# Patient Record
Sex: Female | Born: 1995 | Race: Black or African American | Hispanic: No | Marital: Married | Smoking: Never smoker
Health system: Southern US, Community
[De-identification: ages and names within clinical notes are randomized; demographics above are authoritative.]

## PROBLEM LIST (undated history)

## (undated) DIAGNOSIS — I10 Essential (primary) hypertension: Secondary | ICD-10-CM

---

## 2017-05-23 ENCOUNTER — Inpatient Hospital Stay (HOSPITAL_COMMUNITY): Payer: Medicaid Other

## 2017-05-23 ENCOUNTER — Inpatient Hospital Stay (HOSPITAL_COMMUNITY)
Admission: AD | Admit: 2017-05-23 | Discharge: 2017-05-27 | DRG: 786 | Disposition: A | Discharge status: Home / Self Care | Payer: Medicaid Other | Source: Ambulatory Visit | Attending: Family Medicine | Admitting: Family Medicine

## 2017-05-23 ENCOUNTER — Encounter (HOSPITAL_COMMUNITY): Payer: Self-pay | Admitting: *Deleted

## 2017-05-23 DIAGNOSIS — O411230 Chorioamnionitis, third trimester, not applicable or unspecified: Secondary | ICD-10-CM | POA: Diagnosis present

## 2017-05-23 DIAGNOSIS — O1494 Unspecified pre-eclampsia, complicating childbirth: Secondary | ICD-10-CM | POA: Diagnosis present

## 2017-05-23 DIAGNOSIS — O134 Gestational [pregnancy-induced] hypertension without significant proteinuria, complicating childbirth: Secondary | ICD-10-CM | POA: Diagnosis present

## 2017-05-23 DIAGNOSIS — Z3A42 42 weeks gestation of pregnancy: Secondary | ICD-10-CM | POA: Diagnosis not present

## 2017-05-23 DIAGNOSIS — O411030 Infection of amniotic sac and membranes, unspecified, third trimester, not applicable or unspecified: Secondary | ICD-10-CM | POA: Diagnosis not present

## 2017-05-23 DIAGNOSIS — O480 Post-term pregnancy: Secondary | ICD-10-CM | POA: Diagnosis present

## 2017-05-23 DIAGNOSIS — O9902 Anemia complicating childbirth: Secondary | ICD-10-CM | POA: Diagnosis present

## 2017-05-23 DIAGNOSIS — D649 Anemia, unspecified: Secondary | ICD-10-CM | POA: Diagnosis present

## 2017-05-23 DIAGNOSIS — O133 Gestational [pregnancy-induced] hypertension without significant proteinuria, third trimester: Secondary | ICD-10-CM

## 2017-05-23 HISTORY — DX: Essential (primary) hypertension: I10

## 2017-05-23 LAB — RAPID URINE DRUG SCREEN, HOSP PERFORMED
AMPHETAMINES: NOT DETECTED
BENZODIAZEPINES: NOT DETECTED
Barbiturates: NOT DETECTED
Cocaine: NOT DETECTED
OPIATES: NOT DETECTED
Tetrahydrocannabinol: NOT DETECTED

## 2017-05-23 LAB — COMPREHENSIVE METABOLIC PANEL
ALT: 13 U/L — ABNORMAL LOW (ref 14–54)
AST: 25 U/L (ref 15–41)
Albumin: 2.7 g/dL — ABNORMAL LOW (ref 3.5–5.0)
Alkaline Phosphatase: 142 U/L — ABNORMAL HIGH (ref 38–126)
Anion gap: 10 (ref 5–15)
BUN: 11 mg/dL (ref 6–20)
CHLORIDE: 103 mmol/L (ref 101–111)
CO2: 21 mmol/L — ABNORMAL LOW (ref 22–32)
Calcium: 8.5 mg/dL — ABNORMAL LOW (ref 8.9–10.3)
Creatinine, Ser: 0.67 mg/dL (ref 0.44–1.00)
Glucose, Bld: 93 mg/dL (ref 65–99)
POTASSIUM: 3.8 mmol/L (ref 3.5–5.1)
Sodium: 134 mmol/L — ABNORMAL LOW (ref 135–145)
Total Bilirubin: 0.4 mg/dL (ref 0.3–1.2)
Total Protein: 6.1 g/dL — ABNORMAL LOW (ref 6.5–8.1)

## 2017-05-23 LAB — GROUP B STREP BY PCR: GROUP B STREP BY PCR: NEGATIVE

## 2017-05-23 LAB — TYPE AND SCREEN
ABO/RH(D): O NEG
ANTIBODY SCREEN: NEGATIVE

## 2017-05-23 LAB — CBC
HCT: 30 % — ABNORMAL LOW (ref 36.0–46.0)
Hemoglobin: 9.9 g/dL — ABNORMAL LOW (ref 12.0–15.0)
MCH: 28.3 pg (ref 26.0–34.0)
MCHC: 33 g/dL (ref 30.0–36.0)
MCV: 85.7 fL (ref 78.0–100.0)
PLATELETS: 207 10*3/uL (ref 150–400)
RBC: 3.5 MIL/uL — ABNORMAL LOW (ref 3.87–5.11)
RDW: 16.1 % — AB (ref 11.5–15.5)
WBC: 10.7 10*3/uL — ABNORMAL HIGH (ref 4.0–10.5)

## 2017-05-23 LAB — OB RESULTS CONSOLE GBS: STREP GROUP B AG: NEGATIVE

## 2017-05-23 IMAGING — US US MFM OB LIMITED
1 series · 14 of 14 positions shown · non-contrast
Comparison: none

[Series 1: us mfm ob limited · 14 acquisitions, 14 frames shown]
[im 1/14]
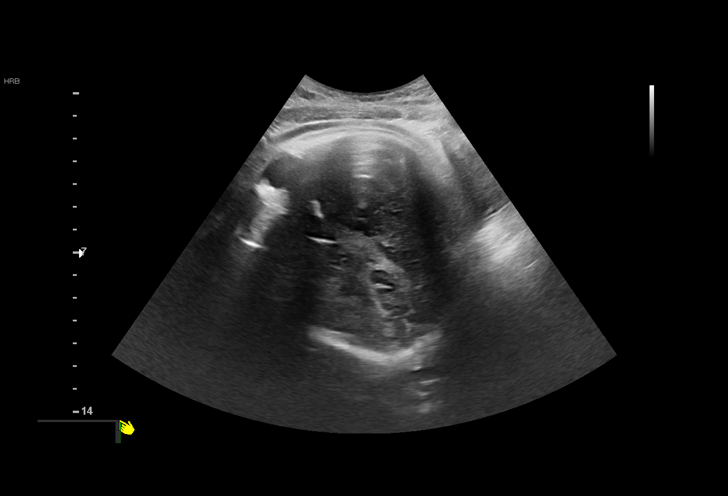
[im 2/14]
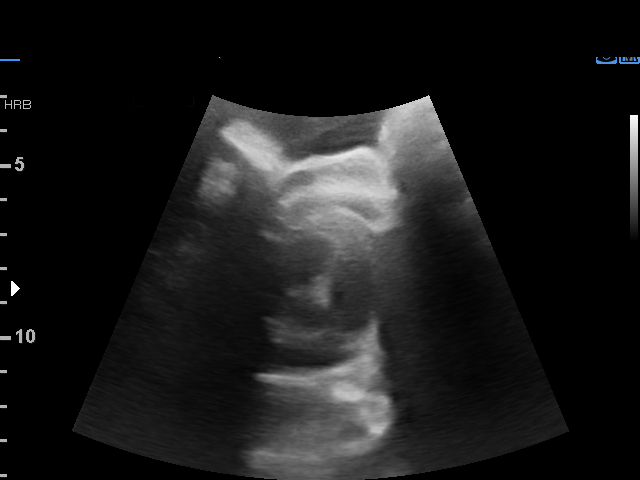
[im 3/14]
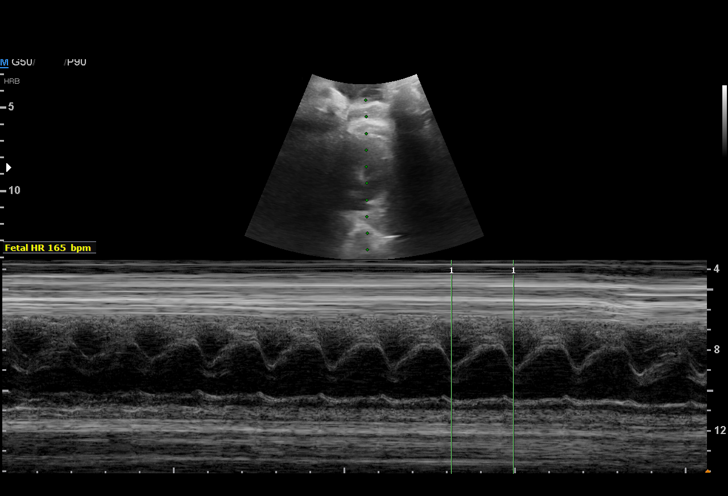
[im 4/14]
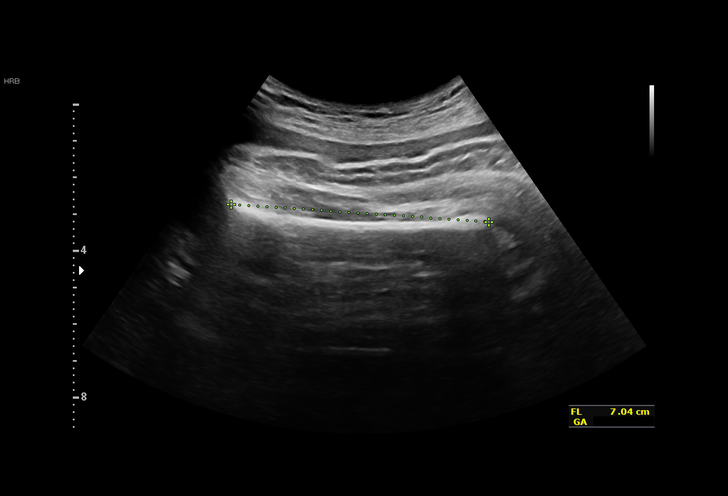
[im 5/14]
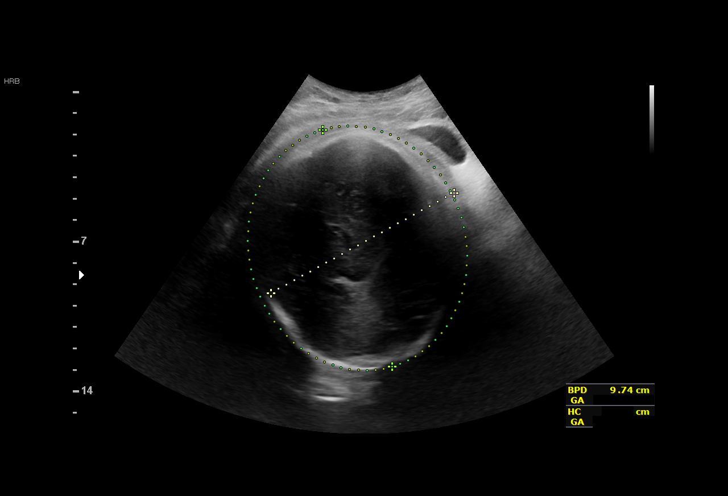
[im 6/14]
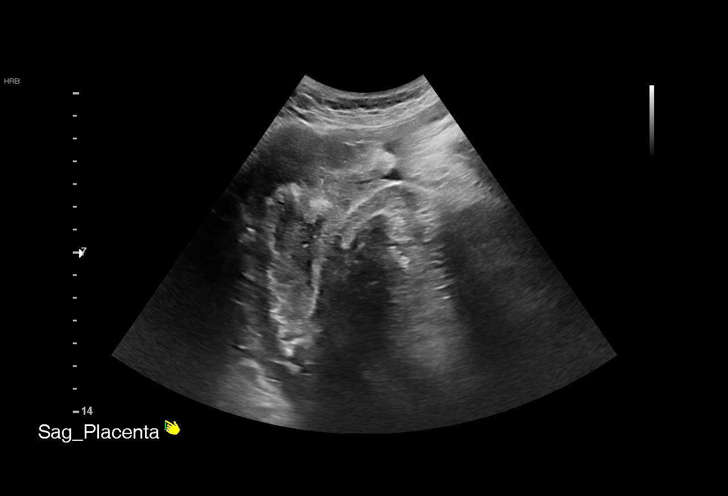
[im 7/14]
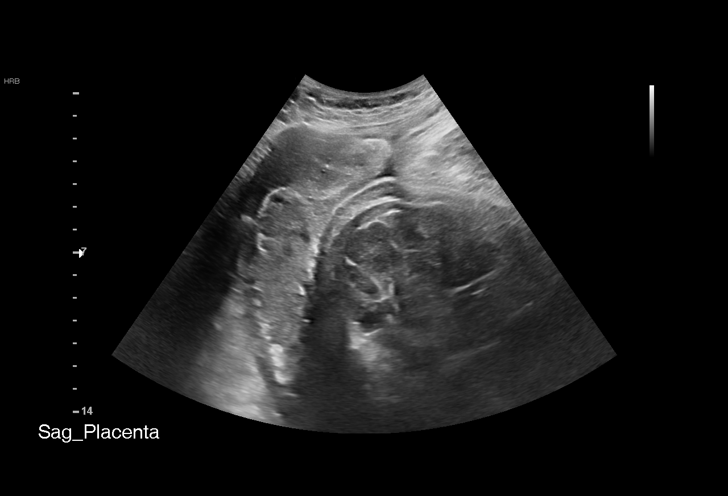
[im 8/14]
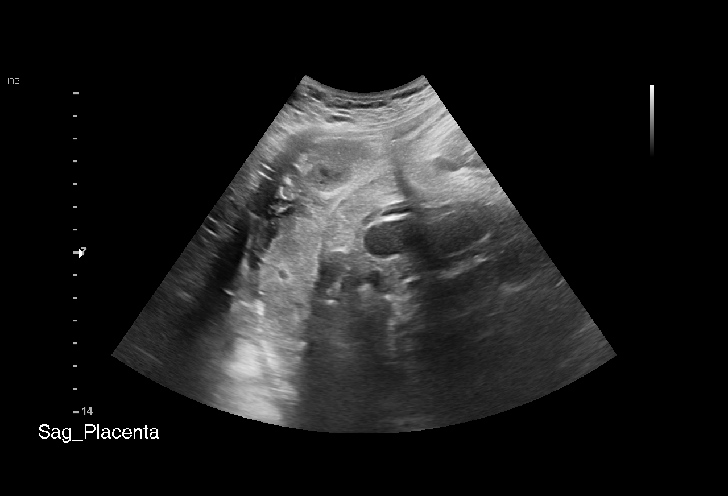
[im 9/14]
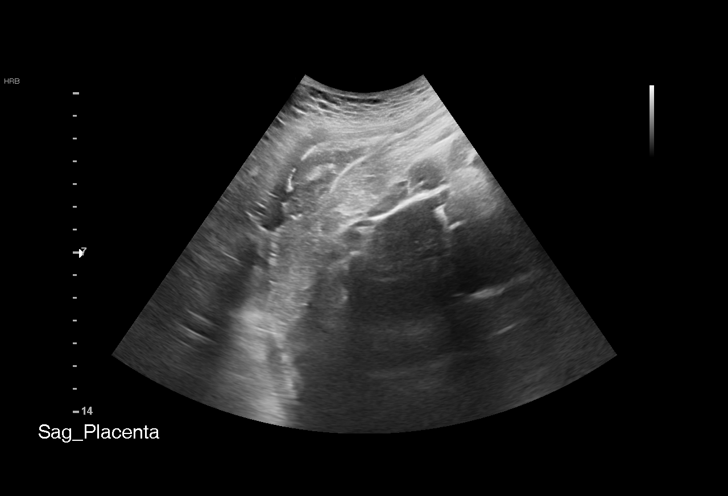
[im 10/14]
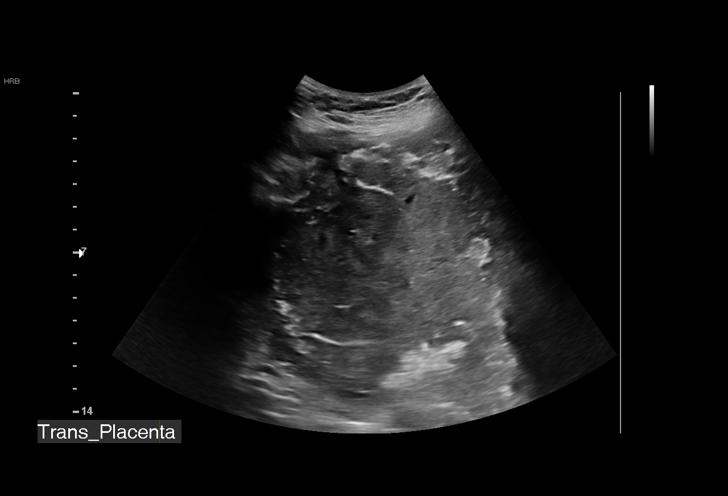
[im 11/14]
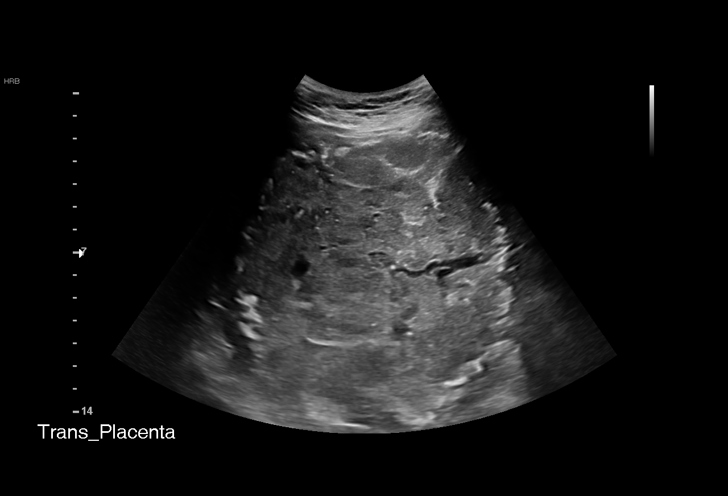
[im 12/14]
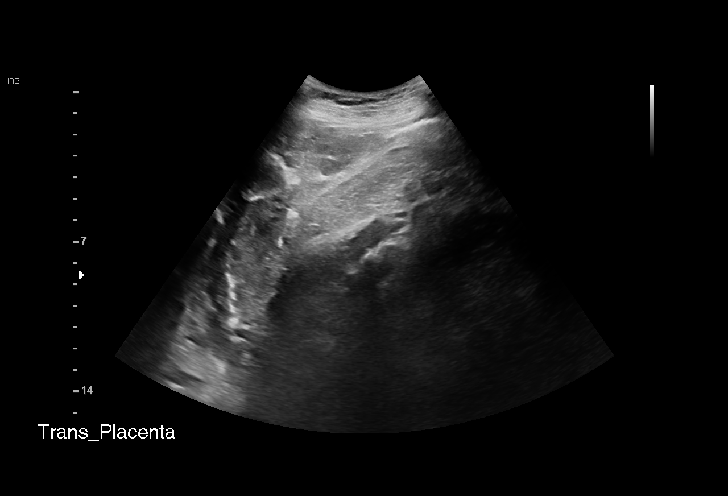
[im 13/14]
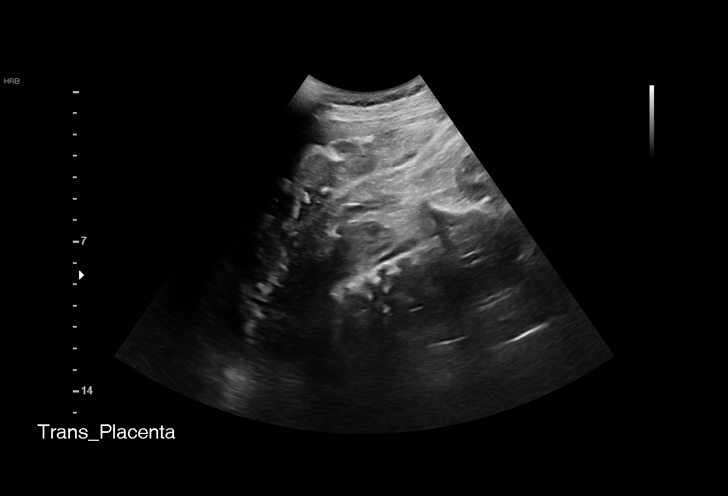
[im 14/14]
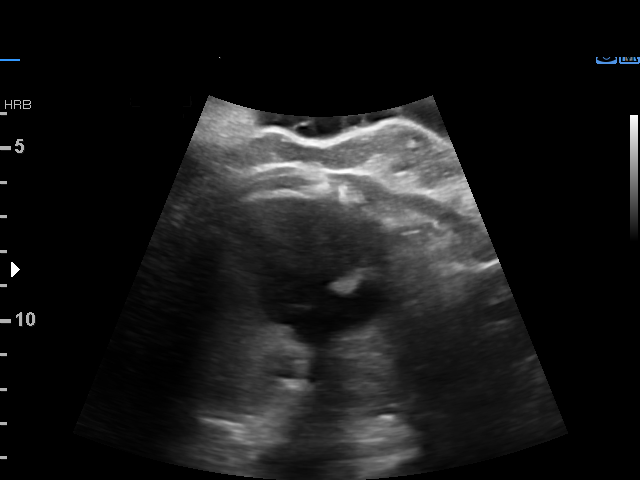

[14 of 14 positions shown; findings below may reference images not displayed]

1  MARCO ANTONIO            [PHONE_NUMBER]      [PHONE_NUMBER]     [PHONE_NUMBER]
Indications

42 weeks gestation of pregnancy
OB History

Blood Type:            Height:  4'11"  Weight (lb):  150       BMI:
Gravidity:    1
Fetal Evaluation

Num Of Fetuses:     1
Fetal Heart         165
Rate(bpm):
Cardiac Activity:   Observed
Placenta:           Visualized
Biometry

BPD:      97.4  mm     G. Age:  39w 6d         64  %    CI:        81.56   %    70 - 86
FL/HC:      20.7   %    20.1 -
HC:      340.4  mm     G. Age:  39w 1d
FL/BPD:     72.3   %
FL:       70.4  mm     G. Age:  36w 1d
Gestational Age

U/S Today:     38w 3d                                        EDD:   [DATE]
Best:          42w 1d     Det. By:  Previous Ultrasound      EDD:   [DATE]
Cervix Uterus Adnexa

Cervix
Not visualized (advanced GA >[CW])
Impression

Singleton intrauterine pregnancy at 42+1 weeks by dates with
limited prenatal care and elevated BP, here for limited scan to
confirm term gestation
Cephalic presentation
Placentation appears normal and there is no previa
Amniotic fluid levels are low
Review of the anatomy was not performed
Biometry is consistent with a term gestation
Recommendations

Continue clinical evaluation and management.

## 2017-05-23 MED ORDER — TERBUTALINE SULFATE 1 MG/ML IJ SOLN: 0.2500 mg | Freq: Once | INTRAMUSCULAR | Status: DC | PRN

## 2017-05-23 MED ORDER — OXYCODONE-ACETAMINOPHEN 5-325 MG PO TABS
2.0000 | ORAL_TABLET | ORAL | Status: DC | PRN
Start: 2017-05-23 — End: 2017-05-25

## 2017-05-23 MED ORDER — OXYTOCIN 40 UNITS IN LACTATED RINGERS INFUSION - SIMPLE MED
1.0000 m[IU]/min | INTRAVENOUS | Status: DC
  Administered 2017-05-24: 1 m[IU]/min via INTRAVENOUS

## 2017-05-23 MED ORDER — LIDOCAINE HCL (PF) 1 % IJ SOLN: 30.0000 mL | INTRAMUSCULAR | Status: DC | PRN

## 2017-05-23 MED ORDER — OXYCODONE-ACETAMINOPHEN 5-325 MG PO TABS: 1.0000 | ORAL_TABLET | ORAL | Status: DC | PRN

## 2017-05-23 MED ORDER — LACTATED RINGERS IV SOLN
500.0000 mL | INTRAVENOUS | Status: DC | PRN
  Administered 2017-05-24: 500 mL via INTRAVENOUS

## 2017-05-23 MED ORDER — SOD CITRATE-CITRIC ACID 500-334 MG/5ML PO SOLN
30.0000 mL | ORAL | Status: DC | PRN
  Administered 2017-05-25: 30 mL via ORAL
  Filled 2017-05-23: qty 15

## 2017-05-23 MED ORDER — ACETAMINOPHEN 325 MG PO TABS
650.0000 mg | ORAL_TABLET | ORAL | Status: DC | PRN
  Administered 2017-05-24: 650 mg via ORAL
  Filled 2017-05-23: qty 2

## 2017-05-23 MED ORDER — OXYTOCIN 40 UNITS IN LACTATED RINGERS INFUSION - SIMPLE MED
2.5000 [IU]/h | INTRAVENOUS | Status: DC
  Filled 2017-05-23: qty 1000

## 2017-05-23 MED ORDER — ONDANSETRON HCL 4 MG/2ML IJ SOLN: 4.0000 mg | Freq: Four times a day (QID) | INTRAMUSCULAR | Status: DC | PRN

## 2017-05-23 MED ORDER — OXYTOCIN BOLUS FROM INFUSION: 500.0000 mL | Freq: Once | INTRAVENOUS | Status: DC

## 2017-05-23 MED ORDER — LACTATED RINGERS IV SOLN
INTRAVENOUS | Status: DC
  Administered 2017-05-23 – 2017-05-24 (×4): via INTRAVENOUS

## 2017-05-23 MED ORDER — NALBUPHINE HCL 10 MG/ML IJ SOLN
5.0000 mg | INTRAMUSCULAR | Status: DC | PRN
Start: 2017-05-23 — End: 2017-05-25

## 2017-05-23 NOTE — Progress Notes (Addendum)
Patient ID: Jamie Brooks, female   DOB: 09/14/1995, 22 y.o.   MRN: [id number] Jamie Brooks is a 22 y.o. G1P0 at 8418w1d admitted for early labor, GHTN  Subjective: Uncomfortable w/ uc's. Denies ha, visual changes, ruq/epigastric pain, n/v.    Objective: BP (!) 133/94   Pulse (!) 105   Temp 98.6 F (37 C) (Oral)   Resp 18   Ht 4\' 11"  (1.499 m)   Wt 68 kg (150 lb)   SpO2 99%   BMI 30.30 kg/m  No intake/output data recorded.  FHT:  FHR: 150 bpm, variability: moderate,  accelerations:  Present,  decelerations:  Present variable, occ prolonged w/ occ tetanic uc or occ q 1min uc's UC:   irregular, every 1-10 minutes  SVE:   Dilation: 4 Effacement (%): 100 Station: -1 Exam by:: kim Chaniece Barbato,cnm  Labs: Lab Results  Component Value Date   WBC 10.7 (H) 05/23/2017   HGB 9.9 (L) 05/23/2017   HCT 30.0 (L) 05/23/2017   MCV 85.7 05/23/2017   PLT 207 05/23/2017    Assessment / Plan: Early labor, no cervical change in >4hrs, bp's stable, P:C ratio still pending. Discussed option of augmenting w/ arom or pitocin. Pt/family prefer pitocin. Does not want AROM at this time. RN can not start pitocin at this time d/t recent prolonged. Will reposition pt, start pitocin 1x1 30min after prolonged  Labor: early Fetal Wellbeing:  Category II Pain Control:  Labor support without medications Pre-eclampsia: labs still pending, bp's stable I/D:  n/a Anticipated MOD:  NSVD  Marge DuncansBooker, Takari Lundahl Randall CNM, WHNP-BC 05/23/2017, 11:44 PM

## 2017-05-23 NOTE — MAU Note (Signed)
Pt reports they were attempting a home birth and has been hurting for 20 hours so they came to the hospital. Denies bleeding or leaking fluid.

## 2017-05-23 NOTE — Anesthesia Pain Management Evaluation Note (Signed)
  CRNA Pain Management Visit Note  Patient: Jamie GreaserMikayla Shin, 22 y.o., female  "Hello I am a member of the anesthesia team at [facility]. We have an anesthesia team available at all times to provide care throughout the hospital, including epidural management and anesthesia for C-section. I don't know your plan for the delivery whether it a natural birth, water birth, IV sedation, nitrous supplementation, doula or epidural, but we want to meet your pain goals."   1.Was your pain managed to your expectations on prior hospitalizations?   No prior hospitalizations  2.What is your expectation for pain management during this hospitalization?     Labor support without medications  3.How can we help you reach that goal? Pt desires natural labor. Pain management options discussed and questions answered.  Record the patient's initial score and the patient's pain goal.   Pain: 5  Pain Goal: 10 [facility] wants you to be able to say your pain was always managed very well.  [place],Arber Wiemers 05/23/2017

## 2017-05-23 NOTE — H&P (Signed)
Jamie Brooks is a 22 y.o. female presenting for contractions. She reports that she has [facility] [place], and was planning on an unassisted homebirth. She states that she had regular prenatal care during the pregnancy, and had a 9 week US to confirm dates. She reports that she had elevated blood pressures around 38, and 39 weeks. She reports that she had lab work done at both of those visits, and it was "normal". However, she stopped going as she neared her due date. She states that she has had contractions for about 24 hours now, and that there has been someone at her house that has checked her cervix. She states that she has not made any change over this time. Patient reports that she is tired, and wishes to stay to have the baby at this time.  OB History    Gravida Para Term Preterm AB Living   1             SAB TAB Ectopic Multiple Live Births                 History reviewed. No pertinent past medical history. History reviewed. No pertinent surgical history. Family History: family history is not on file. Social History:  reports that  has never smoked. she has never used smokeless tobacco. She reports that she does not drink alcohol or use drugs.     Maternal Diabetes: No unknown, attempting to get records.  Genetic Screening: Declined Maternal Ultrasounds/Referrals: Declined Fetal Ultrasounds or other Referrals:  None Maternal Substance Abuse:  No UDS pending  Significant Maternal Medications:  None Significant Maternal Lab Results:  None Other Comments:  Patient reports some prenatal care [facility] [place]. Attempting to get records. GBS unknown.  Review of Systems  Constitutional: Negative for chills and fever.  Eyes: Negative for blurred vision.  Gastrointestinal: Negative for abdominal pain.  Neurological: Negative for headaches.   Maternal Medical History:  Reason for admission: Contractions.   Contractions: Onset was yesterday.    Frequency: regular.   Perceived severity is moderate.    Fetal activity: Perceived fetal activity is normal.   Last perceived fetal movement was within the past hour.    Prenatal Complications - Diabetes: none.      Blood pressure (!) 147/97, pulse 82, temperature 98.5 F (36.9 C), temperature source Oral, resp. rate 16, height 4\' 11"  (1.499 m), weight 150 lb (68 kg), SpO2 100 %. Maternal Exam:  Uterine Assessment: Contraction strength is mild.  Contraction duration is 60 seconds. Contraction frequency is regular.   Abdomen: Patient reports no abdominal tenderness. Fetal presentation: vertex  Introitus: Ferning test: negative.   Pelvis: adequate for delivery.      Fetal Exam Fetal Monitor Review: Mode: ultrasound.   Baseline rate: 145.  Variability: moderate (6-25 bpm).   Pattern: accelerations present and variable decelerations.    Fetal State Assessment: Category II - tracings are indeterminate.     Physical Exam  Nursing note and vitals reviewed. Constitutional: She is oriented to person, place, and time. She appears well-developed and well-nourished. No distress.  HENT:  Head: Normocephalic.  Cardiovascular: Normal rate.  Respiratory: Effort normal.  GI: Soft. There is no tenderness. There is no rebound.  Neurological: She is alert and oriented to person, place, and time.  Skin: Skin is warm and dry.  Psychiatric: She has a normal mood and affect.    Prenatal labs: ABO, Rh:   Antibody:   Rubella:  RPR:    HBsAg:    HIV:    GBS:     Assessment/Plan: 1. Gestational hypertension 2. Post-dates pregnancy 3. Limited prenatal care  Admit to labor and delivery  Rapid GBS pending Cat 2 FHR tracing, will continue to monitor Limited US to confirm dates Consider pitocin PRN labor progress Dr. Adrian Blackwater aware, and agrees with plan   Thressa Sheller 05/23/2017, 6:33 PM

## 2017-05-23 NOTE — Progress Notes (Addendum)
Labor Progress Note Jamie Brooks is a [age] y.o. G1P0 at 4946w1d presented for prolonged home birth labor, her pregnancy is complicated by post-dates and reported diagnosis of preE (she did not come in for IOL).  S:  Patient is endorsing some fatigue, she is still unsure how she would like to augment labor and is mulling it over.   O:  BP 127/81   Pulse 83   Temp 98.6 F (37 C) (Oral)   Resp 18   Ht 4\' 11"  (1.499 m)   Wt 68 kg (150 lb)   SpO2 99%   BMI 30.30 kg/m  EFM: 140/mod var/+accels, occasional variable decels with poor pick up on NST  CVE: Dilation: 4 Effacement (%): 100 Cervical Position: Middle Station: -1 Presentation: Vertex Exam by:: Zorita PangH Hogan CNM   A&P: 22 y.o. G1P0 6346w1d c/b PD, PreE, here for contractions/ IOL. #Labor: Awaiting requests from patient for preference for augmentation of labor, she is continuing to decide risks/ benefits and wants as minimal augmentation as possible, but has been have regular contractions >24 hours. Willing to AROM/ start pit gtt. #PreE: here for labor, Pr/Cr pending. Some HTN but not in severe ranges. #Pain: not requesting epidural / meds at this time. #FWB: Cat II NST #GBS negative  Alroy BailiffParker W Adalynne Steffensmeier, MD 10:24 PM

## 2017-05-24 ENCOUNTER — Inpatient Hospital Stay (HOSPITAL_COMMUNITY): Payer: Medicaid Other | Admitting: Anesthesiology

## 2017-05-24 ENCOUNTER — Other Ambulatory Visit: Payer: Self-pay

## 2017-05-24 LAB — RAPID HIV SCREEN (HIV 1/2 AB+AG)
HIV 1/2 ANTIBODIES: NONREACTIVE
HIV-1 P24 Antigen - HIV24: NONREACTIVE

## 2017-05-24 LAB — PROTEIN / CREATININE RATIO, URINE
Creatinine, Urine: 73 mg/dL
PROTEIN CREATININE RATIO: 1.18 mg/mg{creat} — AB (ref 0.00–0.15)
TOTAL PROTEIN, URINE: 86 mg/dL

## 2017-05-24 LAB — RPR: RPR Ser Ql: NONREACTIVE

## 2017-05-24 LAB — GC/CHLAMYDIA PROBE AMP (~~LOC~~) NOT AT ARMC
Chlamydia: NEGATIVE
NEISSERIA GONORRHEA: NEGATIVE

## 2017-05-24 LAB — CBC
HEMATOCRIT: 30.8 % — AB (ref 36.0–46.0)
Hemoglobin: 10.1 g/dL — ABNORMAL LOW (ref 12.0–15.0)
MCH: 28 pg (ref 26.0–34.0)
MCHC: 32.8 g/dL (ref 30.0–36.0)
MCV: 85.3 fL (ref 78.0–100.0)
Platelets: 211 10*3/uL (ref 150–400)
RBC: 3.61 MIL/uL — ABNORMAL LOW (ref 3.87–5.11)
RDW: 16.2 % — AB (ref 11.5–15.5)
WBC: 13.3 10*3/uL — ABNORMAL HIGH (ref 4.0–10.5)

## 2017-05-24 LAB — OB RESULTS CONSOLE HEPATITIS B SURFACE ANTIGEN: Hepatitis B Surface Ag: NEGATIVE

## 2017-05-24 LAB — HEPATITIS B SURFACE ANTIGEN: HEP B S AG: NEGATIVE

## 2017-05-24 LAB — ABO/RH: ABO/RH(D): O NEG

## 2017-05-24 MED ORDER — EPHEDRINE 5 MG/ML INJ: 10.0000 mg | INTRAVENOUS | Status: DC | PRN

## 2017-05-24 MED ORDER — LABETALOL HCL 5 MG/ML IV SOLN: 20.0000 mg | INTRAVENOUS | Status: DC | PRN

## 2017-05-24 MED ORDER — PHENYLEPHRINE 40 MCG/ML (10ML) SYRINGE FOR IV PUSH (FOR BLOOD PRESSURE SUPPORT)
80.0000 ug | PREFILLED_SYRINGE | INTRAVENOUS | Status: DC | PRN
  Filled 2017-05-24: qty 10

## 2017-05-24 MED ORDER — DIPHENHYDRAMINE HCL 50 MG/ML IJ SOLN: 12.5000 mg | INTRAMUSCULAR | Status: DC | PRN

## 2017-05-24 MED ORDER — LIDOCAINE HCL (PF) 1 % IJ SOLN
INTRAMUSCULAR | Status: DC | PRN
  Administered 2017-05-24 (×2): 4 mL via EPIDURAL

## 2017-05-24 MED ORDER — SODIUM CHLORIDE 0.9 % IV SOLN
2.0000 g | Freq: Four times a day (QID) | INTRAVENOUS | Status: DC
  Administered 2017-05-24: 2 g via INTRAVENOUS
  Filled 2017-05-24 (×3): qty 2000

## 2017-05-24 MED ORDER — FENTANYL 2.5 MCG/ML BUPIVACAINE 1/10 % EPIDURAL INFUSION (WH - ANES)
14.0000 mL/h | INTRAMUSCULAR | Status: DC | PRN
  Administered 2017-05-24: 11 mL/h via EPIDURAL
  Filled 2017-05-24: qty 100

## 2017-05-24 MED ORDER — PHENYLEPHRINE 40 MCG/ML (10ML) SYRINGE FOR IV PUSH (FOR BLOOD PRESSURE SUPPORT): 80.0000 ug | PREFILLED_SYRINGE | INTRAVENOUS | Status: DC | PRN

## 2017-05-24 MED ORDER — GENTAMICIN SULFATE 40 MG/ML IJ SOLN
130.0000 mg | Freq: Three times a day (TID) | INTRAVENOUS | Status: DC
  Administered 2017-05-24: 130 mg via INTRAVENOUS
  Filled 2017-05-24 (×3): qty 3.25

## 2017-05-24 MED ORDER — HYDRALAZINE HCL 20 MG/ML IJ SOLN: 10.0000 mg | Freq: Once | INTRAMUSCULAR | Status: DC | PRN

## 2017-05-24 MED ORDER — LACTATED RINGERS IV SOLN: 500.0000 mL | Freq: Once | INTRAVENOUS | Status: DC

## 2017-05-24 MED ORDER — OXYTOCIN 40 UNITS IN LACTATED RINGERS INFUSION - SIMPLE MED: 1.0000 m[IU]/min | INTRAVENOUS | Status: DC

## 2017-05-24 MED ORDER — ACETAMINOPHEN 325 MG PO TABS
650.0000 mg | ORAL_TABLET | Freq: Four times a day (QID) | ORAL | Status: DC | PRN
Start: 2017-05-24 — End: 2017-05-25

## 2017-05-24 NOTE — Progress Notes (Signed)
Patient ID: Jamie Brooks, female   DOB: 10/05/1995, 22 y.o.   MRN: [id number] Jamie Brooks is a 22 y.o. G1P0 at 3780w2d admitted for early labor, pre-eclampsia  Subjective: Uncomfortable w/ uc's. Denies ha, visual changes, ruq/epigastric pain, n/v.    Objective: BP 136/82   Pulse 82   Temp 98.4 F (36.9 C) (Oral)   Resp 18   Ht 4\' 11"  (1.499 m)   Wt 68 kg (150 lb)   SpO2 99%   BMI 30.30 kg/m  No intake/output data recorded.  FHT:  FHR: 135 bpm, variability: moderate,  accelerations:  Present,  decelerations:  Present occ variables UC:   q 1-737mins  SVE:   Dilation: 5.5 Effacement (%): 100 Station: -1, 0 Exam by:: amwalker,rn  Pitocin @ 4 mu/min  Labs: Lab Results  Component Value Date   WBC 10.7 (H) 05/23/2017   HGB 9.9 (L) 05/23/2017   HCT 30.0 (L) 05/23/2017   MCV 85.7 05/23/2017   PLT 207 05/23/2017    Assessment / Plan: Augmentation of labor, progressing well  Labor: Progressing normally Fetal Wellbeing:  Category II Pain Control:  Labor support without medications Pre-eclampsia: w/o severe fx, bp's stable I/D:  n/a Anticipated MOD:  NSVD  Marge DuncansBooker, Sargent Mankey Randall CNM, WHNP-BC 05/24/2017, 4:12 AM

## 2017-05-24 NOTE — Progress Notes (Signed)
Labor Progress Note  Jamie Brooks is a 22 y.o. G1P0 at 526w2d  admitted for early labor, preeclampsia  S: Comfortable with epidural. Wanting to rest.  O:  BP (!) 131/97   Pulse 89   Temp 97.8 F (36.6 C) (Axillary)   Resp 20   Ht 4\' 11"  (1.499 m)   Wt 150 lb (68 kg)   SpO2 95%   BMI 30.30 kg/m   No intake/output data recorded.  FHT:  FHR: 130 bpm, variability: moderate,  accelerations:  Present,  decelerations:  Present variable UC:   irregular, every 2-7 minutes SVE:   Dilation: 8.5 Effacement (%): 90 Station: +1 Exam by:: sowder Ruptured  Pitocin @ 8 mu/min  Labs: Lab Results  Component Value Date   WBC 13.3 (H) 05/24/2017   HGB 10.1 (L) 05/24/2017   HCT 30.8 (L) 05/24/2017   MCV 85.3 05/24/2017   PLT 211 05/24/2017    Assessment / Plan: 22 y.o. G1P0 6826w2d  in active labor Augmentation of labor, progressing well  Labor: Progressing normally Fetal Wellbeing:  Category II Pain Control:  Epidural Anticipated MOD:  NSVD  Expectant management   Caryl AdaJazma Juley Giovanetti, DO OB Fellow

## 2017-05-24 NOTE — Progress Notes (Signed)
Patient ID: Jamie GreaserMikayla Brooks, female   DOB: 09/22/1995, 22 y.o.   MRN: [id number] Jamie Brooks is a 22 y.o. G1P0 at 8182w2d admitted for early labor, pre-eclampsia w/o severe features  Subjective: Uncomfortable w/ uc's, wants AROM  Objective: BP (!) 156/99   Pulse 80   Temp 97.9 F (36.6 C) (Oral)   Resp 20   Ht 4\' 11"  (1.499 m)   Wt 68 kg (150 lb)   SpO2 97%   BMI 30.30 kg/m  No intake/output data recorded.  FHT:  FHR: 125 bpm, variability: moderate,  accelerations:  Present,  decelerations:  Present occ variable/prolonged UC:   q 1-874mins  SVE:   Dilation: (P) 6 Effacement (%): (P) 100 Station: (P) 0 Exam by:: (P) Evea Sheek  AROM scant amt clear fluid  Pitocin @ 4 mu/min  Labs: Lab Results  Component Value Date   WBC 10.7 (H) 05/23/2017   HGB 9.9 (L) 05/23/2017   HCT 30.0 (L) 05/23/2017   MCV 85.7 05/23/2017   PLT 207 05/23/2017    Assessment / Plan: Augmentation of labor, progressing well, now ruptured  Labor: Progressing normally Fetal Wellbeing:  Category II Pain Control:  Labor support without medications Pre-eclampsia: w/o severe features, bp's stable I/D:  n/a Anticipated MOD:  NSVD  Marge DuncansBooker, Derren Suydam Randall CNM, WHNP-BC 05/24/2017, 8:14 AM

## 2017-05-24 NOTE — Progress Notes (Signed)
ANTIBIOTIC CONSULT NOTE - INITIAL  Pharmacy Consult for Gentamicin Indication: Chorioamnionitis   No Known Allergies  Patient Measurements: Height: 4\' 11"  (149.9 cm) Weight: 150 lb (68 kg) IBW/kg (Calculated) : 43.2 Adjusted Body Weight: 52.3  Vital Signs: Temp: 99.2 F (37.3 C) (01/16 2030) Temp Source: Oral (01/16 2030) BP: 143/111 (01/16 2100) Pulse Rate: 128 (01/16 2100)  Labs: Recent Labs    05/23/17 1742 05/23/17 1910 05/24/17 1321  WBC  --  10.7* 13.3*  HGB  --  9.9* 10.1*  PLT  --  207 211  LABCREA 73.00  --   --   CREATININE  --  0.67  --    No results for input(s): GENTTROUGH, GENTPEAK, GENTRANDOM in the last 72 hours.   Microbiology: Recent Results (from the past 720 hour(s))  OB RESULT CONSOLE Group B Strep     Status: None   Collection Time: 05/23/17 12:00 AM  Result Value Ref Range Status   GBS Negative  Final  Group B strep by PCR     Status: None   Collection Time: 05/23/17  7:35 PM  Result Value Ref Range Status   Group B strep by PCR NEGATIVE NEGATIVE Final    Medications:  Ampicillin 2g IV q6h  Assessment: 22 y.o. female G1P0 at 3650w2d  Estimated Ke = 0.287, Vd = 20.9  Goal of Therapy:  Gentamicin peak 6-8 mg/L and Trough < 1 mg/L  Plan:   Gentamicin 130 mg IV every 8 hrs  Will check gentamicin levels if continued > 72hr or clinically indicated.  Jamie Brooks, [name] 05/24/2017,10:26 PM

## 2017-05-24 NOTE — Anesthesia Preprocedure Evaluation (Signed)
Anesthesia Evaluation  Patient identified by MRN, date of birth, ID band Patient awake    Reviewed: Allergy & Precautions, NPO status , Patient's Chart, lab work & pertinent test results  Airway Mallampati: II  TM Distance: >3 FB Neck ROM: Full    Dental no notable dental hx. (+) Dental Advisory Given   Pulmonary neg pulmonary ROS,    Pulmonary exam normal        Cardiovascular hypertension, negative cardio ROS Normal cardiovascular exam     Neuro/Psych negative neurological ROS  negative psych ROS   GI/Hepatic negative GI ROS, Neg liver ROS,   Endo/Other  negative endocrine ROS  Renal/GU negative Renal ROS  negative genitourinary   Musculoskeletal negative musculoskeletal ROS (+)   Abdominal   Peds negative pediatric ROS (+)  Hematology negative hematology ROS (+)   Anesthesia Other Findings   Reproductive/Obstetrics (+) Pregnancy                             Anesthesia Physical Anesthesia Plan  ASA: III  Anesthesia Plan: Epidural   Post-op Pain Management:    Induction:   PONV Risk Score and Plan:   Airway Management Planned: Natural Airway  Additional Equipment:   Intra-op Plan:   Post-operative Plan:   Informed Consent: I have reviewed the patients History and Physical, chart, labs and discussed the procedure including the risks, benefits and alternatives for the proposed anesthesia with the patient or authorized representative who has indicated his/her understanding and acceptance.   Dental advisory given  Plan Discussed with: Anesthesiologist  Anesthesia Plan Comments:         Anesthesia Quick Evaluation

## 2017-05-24 NOTE — Progress Notes (Signed)
Patient ID: Jamie Brooks, female   DOB: 11/10/1995, 22 y.o.   MRN: [id number] Discussed labor coping and reviewed options. Patient decided on epidural after thorough discussion.   Plan - CBC (last was > 6 hrs prior) - Epidural placement - 1-2hr rest before next cervical exam check

## 2017-05-24 NOTE — Progress Notes (Signed)
Patient ID: Jamie Brooks, female   DOB: 05/13/1995, 22 y.o.   MRN: [id number]  [facility] for fetal bradycardia for 6 minutes. Position chagnes had been made, FSE placed and pitocin stopped.  I examined patient and FSE mostly on maternal cervix and thus not correctly monitoring fetal HR. We placed typical abdominal monitor and were able to assure fetal well being with FHR in 130s. Given non-emergent nature discussed replacement vs external monitor. Patient opted for replacement of internal monitors. Placed new FSE and IUPC. Patient progressed to 7.5/8 cm.   Federico FlakeKimberly Niles Cabell Lazenby, MD, MPH, ABFM Attending Physician Faculty Practice- [facility]

## 2017-05-24 NOTE — Anesthesia Procedure Notes (Signed)
Epidural Patient location during procedure: OB Start time: 05/24/2017 1:51 PM End time: 05/24/2017 2:05 PM  Staffing Anesthesiologist: Heather RobertsSinger, Marlet Korte, MD Performed: anesthesiologist   Preanesthetic Checklist Completed: patient identified, site marked, pre-op evaluation, timeout performed, IV checked, risks and benefits discussed and monitors and equipment checked  Epidural Patient position: sitting Prep: DuraPrep Patient monitoring: heart rate, cardiac monitor, continuous pulse ox and blood pressure Approach: midline Location: L2-L3 Injection technique: LOR saline  Needle:  Needle type: Tuohy  Needle gauge: 17 G Needle length: 9 cm Needle insertion depth: 5 cm Catheter size: 20 Guage Catheter at skin depth: 10 cm Test dose: negative and Other  Assessment Events: blood not aspirated, injection not painful, no injection resistance and negative IV test  Additional Notes Informed consent obtained prior to proceeding including risk of failure, 1% risk of PDPH, risk of minor discomfort and bruising.  Discussed rare but serious complications including epidural abscess, permanent nerve injury, epidural hematoma.  Discussed alternatives to epidural analgesia and patient desires to proceed.  Timeout performed pre-procedure verifying patient name, procedure, and platelet count.  Patient tolerated procedure well.

## 2017-05-24 NOTE — Progress Notes (Signed)
LABOR PROGRESS NOTE  Karma GreaserMikayla Mccanless is a 22 y.o. G1P0 at 8265w2d  admitted for labor and pre-eclampsia  Subjective: Patient doing well   Objective: BP (!) 143/111   Pulse (!) 128   Temp 99.2 F (37.3 C) (Oral)   Resp 16   Ht 4\' 11"  (1.499 m)   Wt 150 lb (68 kg)   SpO2 95%   BMI 30.30 kg/m  or  Vitals:   05/24/17 1930 05/24/17 2000 05/24/17 2030 05/24/17 2100  BP: (!) 136/97 132/86 140/88 (!) 143/111  Pulse: (!) 115 (!) 119 (!) 122 (!) 128  Resp: 18 16 18 16   Temp:   99.2 F (37.3 C)   TempSrc:   Oral   SpO2:      Weight:      Height:        SVE at 21:15 Dilation: Lip/rim Effacement (%): 100 Cervical Position: Middle Station: 0 Presentation: Vertex Exam by:: Dr. Despina HiddenEure FHT: baseline rate 150, moderate varibility, +acel, no decel Toco: ctx q5-6 min  Labs: Lab Results  Component Value Date   WBC 13.3 (H) 05/24/2017   HGB 10.1 (L) 05/24/2017   HCT 30.8 (L) 05/24/2017   MCV 85.3 05/24/2017   PLT 211 05/24/2017    Patient Active Problem List   Diagnosis Date Noted  . Post-dates pregnancy 05/23/2017    Assessment / Plan: 22 y.o. G1P0 at 9565w2d here for labor and pre-eclampsia  Labor: Patient with arrest of dilation since for 6-7 hour how. Dr. Despina HiddenEure re-checked patient and confirmed still no change, and discussed risk and benefits of proceeding with C/S. Pt agrees.  The risks of cesarean section discussed with the patient included but were not limited to: bleeding which may require transfusion or reoperation; infection which may require antibiotics; injury to bowel, bladder, ureters or other surrounding organs; injury to the fetus; need for additional procedures including hysterectomy in the event of a life-threatening hemorrhage; placental abnormalities wth subsequent pregnancies, incisional problems, thromboembolic phenomenon and other postoperative/anesthesia complications. The patient concurred with the proposed plan, giving informed written consent for the  procedure. Anesthesia and OR aware. Preoperative prophylactic antibiotics and SCDs ordered on call to the OR. To OR when ready.   Fetal Wellbeing:  Cat II Pain Control:  Epidural Anticipated MOD:  C/S  Frederik PearJulie P Artelia Game, MD 05/24/2017, 9:53 PM

## 2017-05-25 ENCOUNTER — Encounter (HOSPITAL_COMMUNITY): Payer: Self-pay | Admitting: Anesthesiology

## 2017-05-25 ENCOUNTER — Encounter (HOSPITAL_COMMUNITY)
Admission: AD | Disposition: A | Discharge status: Home / Self Care | Payer: Self-pay | Source: Ambulatory Visit | Attending: Family Medicine

## 2017-05-25 DIAGNOSIS — O480 Post-term pregnancy: Secondary | ICD-10-CM

## 2017-05-25 DIAGNOSIS — Z3A42 42 weeks gestation of pregnancy: Secondary | ICD-10-CM

## 2017-05-25 DIAGNOSIS — O411030 Infection of amniotic sac and membranes, unspecified, third trimester, not applicable or unspecified: Secondary | ICD-10-CM

## 2017-05-25 DIAGNOSIS — O1494 Unspecified pre-eclampsia, complicating childbirth: Secondary | ICD-10-CM

## 2017-05-25 LAB — CBC
HCT: 26 % — ABNORMAL LOW (ref 36.0–46.0)
HEMOGLOBIN: 8.5 g/dL — AB (ref 12.0–15.0)
MCH: 27.9 pg (ref 26.0–34.0)
MCHC: 32.7 g/dL (ref 30.0–36.0)
MCV: 85.2 fL (ref 78.0–100.0)
Platelets: 162 10*3/uL (ref 150–400)
RBC: 3.05 MIL/uL — ABNORMAL LOW (ref 3.87–5.11)
RDW: 16.7 % — ABNORMAL HIGH (ref 11.5–15.5)
WBC: 12.3 10*3/uL — AB (ref 4.0–10.5)

## 2017-05-25 LAB — RUBELLA SCREEN: Rubella: 1.04 index (ref >0.99)

## 2017-05-25 SURGERY — Surgical Case
Anesthesia: Epidural

## 2017-05-25 MED ORDER — SIMETHICONE 80 MG PO CHEW
80.0000 mg | CHEWABLE_TABLET | Freq: Three times a day (TID) | ORAL | Status: DC
  Administered 2017-05-25 – 2017-05-26 (×6): 80 mg via ORAL
  Filled 2017-05-25 (×7): qty 1

## 2017-05-25 MED ORDER — SODIUM CHLORIDE 0.9 % IR SOLN
Status: DC | PRN
  Administered 2017-05-25: 1

## 2017-05-25 MED ORDER — MENTHOL 3 MG MT LOZG: 1.0000 | LOZENGE | OROMUCOSAL | Status: DC | PRN

## 2017-05-25 MED ORDER — DEXAMETHASONE SODIUM PHOSPHATE 10 MG/ML IJ SOLN
INTRAMUSCULAR | Status: AC
  Filled 2017-05-25: qty 1

## 2017-05-25 MED ORDER — KETOROLAC TROMETHAMINE 30 MG/ML IJ SOLN: 30.0000 mg | Freq: Four times a day (QID) | INTRAMUSCULAR | Status: AC | PRN

## 2017-05-25 MED ORDER — WITCH HAZEL-GLYCERIN EX PADS: 1.0000 "application " | MEDICATED_PAD | CUTANEOUS | Status: DC | PRN

## 2017-05-25 MED ORDER — SODIUM BICARBONATE 8.4 % IV SOLN
INTRAVENOUS | Status: DC | PRN
  Administered 2017-05-25 (×3): 5 mL via EPIDURAL

## 2017-05-25 MED ORDER — LIDOCAINE-EPINEPHRINE (PF) 2 %-1:200000 IJ SOLN
INTRAMUSCULAR | Status: AC
  Filled 2017-05-25: qty 20

## 2017-05-25 MED ORDER — DIBUCAINE 1 % RE OINT: 1.0000 "application " | TOPICAL_OINTMENT | RECTAL | Status: DC | PRN

## 2017-05-25 MED ORDER — LACTATED RINGERS IV SOLN
INTRAVENOUS | Status: DC
  Administered 2017-05-25 (×2): via INTRAVENOUS

## 2017-05-25 MED ORDER — COCONUT OIL OIL
1.0000 "application " | TOPICAL_OIL | Status: DC | PRN
  Administered 2017-05-26: 1 via TOPICAL
  Filled 2017-05-25: qty 120

## 2017-05-25 MED ORDER — FENTANYL CITRATE (PF) 100 MCG/2ML IJ SOLN: 25.0000 ug | INTRAMUSCULAR | Status: DC | PRN

## 2017-05-25 MED ORDER — OXYCODONE HCL 5 MG PO TABS: 5.0000 mg | ORAL_TABLET | Freq: Once | ORAL | Status: DC | PRN

## 2017-05-25 MED ORDER — OXYTOCIN 10 UNIT/ML IJ SOLN
INTRAMUSCULAR | Status: AC
  Filled 2017-05-25: qty 4

## 2017-05-25 MED ORDER — SIMETHICONE 80 MG PO CHEW: 80.0000 mg | CHEWABLE_TABLET | ORAL | Status: DC | PRN

## 2017-05-25 MED ORDER — OXYCODONE HCL 5 MG PO TABS: 10.0000 mg | ORAL_TABLET | ORAL | Status: DC | PRN

## 2017-05-25 MED ORDER — SODIUM BICARBONATE 8.4 % IV SOLN
INTRAVENOUS | Status: AC
  Filled 2017-05-25: qty 50

## 2017-05-25 MED ORDER — OXYCODONE HCL 5 MG PO TABS: 5.0000 mg | ORAL_TABLET | ORAL | Status: DC | PRN

## 2017-05-25 MED ORDER — ONDANSETRON HCL 4 MG/2ML IJ SOLN
INTRAMUSCULAR | Status: AC
  Filled 2017-05-25: qty 2

## 2017-05-25 MED ORDER — ONDANSETRON HCL 4 MG/2ML IJ SOLN
INTRAMUSCULAR | Status: DC | PRN
  Administered 2017-05-25: 4 mg via INTRAVENOUS

## 2017-05-25 MED ORDER — MEPERIDINE HCL 25 MG/ML IJ SOLN: 6.2500 mg | INTRAMUSCULAR | Status: DC | PRN

## 2017-05-25 MED ORDER — IBUPROFEN 600 MG PO TABS
600.0000 mg | ORAL_TABLET | Freq: Four times a day (QID) | ORAL | Status: DC
  Administered 2017-05-25 – 2017-05-27 (×8): 600 mg via ORAL
  Filled 2017-05-25 (×9): qty 1

## 2017-05-25 MED ORDER — OXYTOCIN 10 UNIT/ML IJ SOLN
INTRAMUSCULAR | Status: DC | PRN
  Administered 2017-05-25: 40 [IU] via INTRAVENOUS

## 2017-05-25 MED ORDER — OXYTOCIN 40 UNITS IN LACTATED RINGERS INFUSION - SIMPLE MED: 2.5000 [IU]/h | INTRAVENOUS | Status: AC

## 2017-05-25 MED ORDER — SENNOSIDES-DOCUSATE SODIUM 8.6-50 MG PO TABS
2.0000 | ORAL_TABLET | ORAL | Status: DC
  Administered 2017-05-26 (×2): 2 via ORAL
  Filled 2017-05-25 (×2): qty 2

## 2017-05-25 MED ORDER — FERROUS SULFATE 325 (65 FE) MG PO TABS
325.0000 mg | ORAL_TABLET | Freq: Two times a day (BID) | ORAL | Status: DC
  Administered 2017-05-25 – 2017-05-27 (×5): 325 mg via ORAL
  Filled 2017-05-25 (×5): qty 1

## 2017-05-25 MED ORDER — LACTATED RINGERS IV SOLN
INTRAVENOUS | Status: DC | PRN
  Administered 2017-05-25: 01:00:00 via INTRAVENOUS

## 2017-05-25 MED ORDER — TETANUS-DIPHTH-ACELL PERTUSSIS 5-2.5-18.5 LF-MCG/0.5 IM SUSP: 0.5000 mL | Freq: Once | INTRAMUSCULAR | Status: DC

## 2017-05-25 MED ORDER — SODIUM CHLORIDE 0.9 % IV SOLN
3.0000 g | Freq: Four times a day (QID) | INTRAVENOUS | Status: AC
  Administered 2017-05-25 – 2017-05-26 (×4): 3 g via INTRAVENOUS
  Filled 2017-05-25 (×4): qty 3

## 2017-05-25 MED ORDER — OXYCODONE HCL 5 MG/5ML PO SOLN: 5.0000 mg | Freq: Once | ORAL | Status: DC | PRN

## 2017-05-25 MED ORDER — MORPHINE SULFATE (PF) 0.5 MG/ML IJ SOLN
INTRAMUSCULAR | Status: AC
  Filled 2017-05-25: qty 10

## 2017-05-25 MED ORDER — ACETAMINOPHEN 325 MG PO TABS: 650.0000 mg | ORAL_TABLET | ORAL | Status: DC | PRN

## 2017-05-25 MED ORDER — PROMETHAZINE HCL 25 MG/ML IJ SOLN: 6.2500 mg | INTRAMUSCULAR | Status: DC | PRN

## 2017-05-25 MED ORDER — DIPHENHYDRAMINE HCL 25 MG PO CAPS
25.0000 mg | ORAL_CAPSULE | Freq: Four times a day (QID) | ORAL | Status: DC | PRN
  Administered 2017-05-25 (×2): 25 mg via ORAL
  Filled 2017-05-25 (×2): qty 1

## 2017-05-25 MED ORDER — DEXAMETHASONE SODIUM PHOSPHATE 10 MG/ML IJ SOLN
INTRAMUSCULAR | Status: DC | PRN
Start: 2017-05-25 — End: 2017-05-25
  Administered 2017-05-25: 10 mg via INTRAVENOUS

## 2017-05-25 MED ORDER — LACTATED RINGERS IV SOLN
INTRAVENOUS | Status: DC | PRN
  Administered 2017-05-25: 02:00:00 via INTRAVENOUS

## 2017-05-25 MED ORDER — SCOPOLAMINE 1 MG/3DAYS TD PT72
MEDICATED_PATCH | TRANSDERMAL | Status: DC | PRN
  Administered 2017-05-25: 1 via TRANSDERMAL

## 2017-05-25 MED ORDER — MEPERIDINE HCL 25 MG/ML IJ SOLN
INTRAMUSCULAR | Status: DC | PRN
  Administered 2017-05-25 (×2): 12.5 mg via INTRAVENOUS

## 2017-05-25 MED ORDER — MORPHINE SULFATE (PF) 0.5 MG/ML IJ SOLN
INTRAMUSCULAR | Status: DC | PRN
  Administered 2017-05-25: 3 mg via EPIDURAL

## 2017-05-25 MED ORDER — SCOPOLAMINE 1 MG/3DAYS TD PT72
MEDICATED_PATCH | TRANSDERMAL | Status: AC
  Filled 2017-05-25: qty 1

## 2017-05-25 MED ORDER — ZOLPIDEM TARTRATE 5 MG PO TABS: 5.0000 mg | ORAL_TABLET | Freq: Every evening | ORAL | Status: DC | PRN

## 2017-05-25 MED ORDER — SIMETHICONE 80 MG PO CHEW
80.0000 mg | CHEWABLE_TABLET | ORAL | Status: DC
  Administered 2017-05-26 (×2): 80 mg via ORAL
  Filled 2017-05-25 (×2): qty 1

## 2017-05-25 MED ORDER — PRENATAL MULTIVITAMIN CH
1.0000 | ORAL_TABLET | Freq: Every day | ORAL | Status: DC
  Administered 2017-05-25 – 2017-05-27 (×3): 1 via ORAL
  Filled 2017-05-25 (×3): qty 1

## 2017-05-25 MED ORDER — MEPERIDINE HCL 25 MG/ML IJ SOLN
INTRAMUSCULAR | Status: AC
Start: 2017-05-25 — End: ?
  Filled 2017-05-25: qty 1

## 2017-05-25 SURGICAL SUPPLY — 38 items
CHLORAPREP W/TINT 26ML (MISCELLANEOUS) ×6 IMPLANT
CLAMP CORD UMBIL (MISCELLANEOUS) IMPLANT
CLOTH BEACON ORANGE TIMEOUT ST (SAFETY) ×3 IMPLANT
DERMABOND ADHESIVE PROPEN (GAUZE/BANDAGES/DRESSINGS) ×2
DERMABOND ADVANCED (GAUZE/BANDAGES/DRESSINGS) ×4
DERMABOND ADVANCED .7 DNX12 (GAUZE/BANDAGES/DRESSINGS) ×2 IMPLANT
DERMABOND ADVANCED .7 DNX6 (GAUZE/BANDAGES/DRESSINGS) ×1 IMPLANT
DRSG OPSITE POSTOP 4X10 (GAUZE/BANDAGES/DRESSINGS) ×3 IMPLANT
ELECT REM PT RETURN 9FT ADLT (ELECTROSURGICAL) ×3
ELECTRODE REM PT RTRN 9FT ADLT (ELECTROSURGICAL) ×1 IMPLANT
EXTRACTOR VACUUM BELL STYLE (SUCTIONS) IMPLANT
GLOVE BIOGEL PI IND STRL 7.0 (GLOVE) ×1 IMPLANT
GLOVE BIOGEL PI IND STRL 8 (GLOVE) ×1 IMPLANT
GLOVE BIOGEL PI INDICATOR 7.0 (GLOVE) ×2
GLOVE BIOGEL PI INDICATOR 8 (GLOVE) ×2
GLOVE ECLIPSE 8.0 STRL XLNG CF (GLOVE) ×3 IMPLANT
GOWN STRL REUS W/TWL LRG LVL3 (GOWN DISPOSABLE) ×6 IMPLANT
KIT ABG SYR 3ML LUER SLIP (SYRINGE) ×3 IMPLANT
NEEDLE HYPO 18GX1.5 BLUNT FILL (NEEDLE) ×3 IMPLANT
NEEDLE HYPO 22GX1.5 SAFETY (NEEDLE) ×3 IMPLANT
NEEDLE HYPO 25X5/8 SAFETYGLIDE (NEEDLE) ×3 IMPLANT
NS IRRIG 1000ML POUR BTL (IV SOLUTION) ×3 IMPLANT
PACK C SECTION WH (CUSTOM PROCEDURE TRAY) ×3 IMPLANT
PAD OB MATERNITY 4.3X12.25 (PERSONAL CARE ITEMS) ×3 IMPLANT
PENCIL SMOKE EVAC W/HOLSTER (ELECTROSURGICAL) ×3 IMPLANT
RTRCTR C-SECT PINK 25CM LRG (MISCELLANEOUS) IMPLANT
SUT CHROMIC 0 CT 1 (SUTURE) ×3 IMPLANT
SUT MNCRL 0 VIOLET CTX 36 (SUTURE) ×2 IMPLANT
SUT MONOCRYL 0 CTX 36 (SUTURE) ×4
SUT PLAIN 2 0 (SUTURE)
SUT PLAIN 2 0 XLH (SUTURE) IMPLANT
SUT PLAIN ABS 2-0 CT1 27XMFL (SUTURE) IMPLANT
SUT VIC AB 0 CTX 36 (SUTURE) ×2
SUT VIC AB 0 CTX36XBRD ANBCTRL (SUTURE) ×1 IMPLANT
SUT VIC AB 4-0 KS 27 (SUTURE) IMPLANT
SYR 20CC LL (SYRINGE) ×6 IMPLANT
TOWEL OR 17X24 6PK STRL BLUE (TOWEL DISPOSABLE) ×3 IMPLANT
TRAY FOLEY BAG SILVER LF 14FR (SET/KITS/TRAYS/PACK) IMPLANT

## 2017-05-25 NOTE — Transfer of Care (Signed)
Immediate Anesthesia Transfer of Care Note  Patient: Jamie Brooks  Procedure(s) Performed: CESAREAN SECTION (N/A )  Patient Location: PACU  Anesthesia Type:Epidural  Level of Consciousness: awake, alert , oriented and patient cooperative  Airway & Oxygen Therapy: Patient Spontanous Breathing  Post-op Assessment: Report given to RN and Post -op Vital signs reviewed and stable  Post vital signs: Reviewed and stable  Last Vitals:  Vitals:   05/24/17 2300 05/24/17 2330  BP: 134/68 122/60  Pulse: (!) 129 (!) 132  Resp: 16 14  Temp: (!) 38.4 C   SpO2:      Last Pain:  Vitals:   05/24/17 2330  TempSrc:   PainSc: 0-No pain         Complications: No apparent anesthesia complications

## 2017-05-25 NOTE — Anesthesia Postprocedure Evaluation (Signed)
Anesthesia Post Note  Patient: Jamie Brooks  Procedure(s) Performed: CESAREAN SECTION (N/A )     Patient location during evaluation: PACU Anesthesia Type: Epidural Level of consciousness: awake and alert Pain management: pain level controlled Vital Signs Assessment: post-procedure vital signs reviewed and stable Respiratory status: spontaneous breathing, nonlabored ventilation and respiratory function stable Cardiovascular status: stable Postop Assessment: no headache, no backache and epidural receding Anesthetic complications: no    Last Vitals:  Vitals:   05/25/17 0345 05/25/17 0445  BP: (!) 142/89 (!) 152/100  Pulse: (!) 103 92  Resp: 18 18  Temp: 37.2 C 37.2 C  SpO2: 98%     Last Pain:  Vitals:   05/25/17 0445  TempSrc: Oral  PainSc:    Pain Goal: Patients Stated Pain Goal: 5 (05/25/17 0330)               Beryle Lathehomas E Brock

## 2017-05-25 NOTE — Progress Notes (Signed)
Residents notified of patients HR.Patient asymptomatic. They are on the unit and will assess. No new orders at this time. Royston CowperIsley, Camdynn Maranto E, RN

## 2017-05-25 NOTE — Progress Notes (Signed)
PROGRESS NOTE  Edgardo RoysMikayla Sullivan Jamie Brooks is a 22 y.o. G1P0 at 3566w2d  admitted for labor and pre-eclampsia.  Contacted by nursing for persistent tachycardia to 120s.   Subjective: Patient sitting in chair upon encounter.  Reports that she is doing well with no complaints.  Denies excessive pain, discomfort.  Denies dizziness.  No bleeding.  Objective: BP 134/82 (BP Location: Left Arm)   Pulse (!) 126   Temp 98.6 F (37 C) (Oral)   Resp 18   Ht 4\' 11"  (1.499 m)   Wt 68 kg (150 lb)   SpO2 95%   Breastfeeding? Unknown   BMI 30.30 kg/m  or   Physical Exam: General: Appears well, in no acute distress Heart: Elevated rate, regular rhythm.  No murmurs appreciated Lungs: CTAB, normal WOB Skin: warm, dry, cap refill <2 seconds Extremities: no edema in legs  Labs: Lab Results  Component Value Date   WBC 12.3 (H) 05/25/2017   HGB 8.5 (L) 05/25/2017   HCT 26.0 (L) 05/25/2017   MCV 85.2 05/25/2017   PLT 162 05/25/2017    Patient Active Problem List   Diagnosis Date Noted  . Post-dates pregnancy 05/23/2017    Assessment / Plan: 22 y.o. G1P0 at 7166w2d POD1 after caesarian section with persistent tachycardia to 120s over the past 24 hours.  Patient appears well, denies any pain or discomfort, and is afebrile.  Tachycardia is most likely compensatory response to blood loss from caesarian section.  Patient could also be minimally hypovolemic, though physical exam makes this less concerning. Though patient is being treated for triple I, infection as cause of tachycardia is less likely due to being afebrile and receiving abx.  Tachycardia: - continue to monitor HR - continue to monitor Hgb - continue mIVF  Mikal PlaneBenjamin A Bathsheba Durrett, MS3 05/25/2017, 7:03 PM

## 2017-05-25 NOTE — Progress Notes (Signed)
Edgardo RoysMikayla Sullivan LoneGilbert is a 22 y.o. G1P0 at 7240w3d by LMP admitted for active labor  Subjective:   Objective: BP 122/60   Pulse (!) 132   Temp (!) 101.1 F (38.4 C) (Oral)   Resp 14   Ht 4\' 11"  (1.499 m)   Wt 150 lb (68 kg)   SpO2 95%   BMI 30.30 kg/m  No intake/output data recorded. No intake/output data recorded.  FHT:  FHR: 130s bpm, variability: moderate,  accelerations:  Present,  decelerations:  Present variables intrmittent UC:   regular, every 3 minutes SVE:   Dilation: Lip/rim Effacement (%): 100 Station: 0 Exam by:: Dr. Despina HiddenEure  Labs: Lab Results  Component Value Date   WBC 13.3 (H) 05/24/2017   HGB 10.1 (L) 05/24/2017   HCT 30.8 (L) 05/24/2017   MCV 85.3 05/24/2017   PLT 211 05/24/2017    Assessment / Plan: Arrest in active phase of labor  Labor: arrested no change since 1545 Preeclampsia:  no signs or symptoms of toxicity Fetal Wellbeing:  Category II Pain Control:  Labor support without medications I/D:   Anticipated MOD:  proceed with primary Caesarean section, pt aware there are 3 c sections in front of her  Lazaro ArmsLuther H Tyniya Kuyper

## 2017-05-25 NOTE — Anesthesia Postprocedure Evaluation (Signed)
Anesthesia Post Note  Patient: Jamie Brooks  Procedure(s) Performed: CESAREAN SECTION (N/A )     Patient location during evaluation: Mother Baby Anesthesia Type: Epidural Level of consciousness: awake and alert and oriented Pain management: satisfactory to patient Vital Signs Assessment: post-procedure vital signs reviewed and stable Respiratory status: respiratory function stable Cardiovascular status: stable Postop Assessment: no headache, no backache, epidural receding, patient able to bend at knees, no signs of nausea or vomiting and adequate PO intake Anesthetic complications: no    Last Vitals:  Vitals:   05/25/17 0545 05/25/17 0646  BP: (!) 152/96 (!) 145/90  Pulse: (!) 113 (!) 125  Resp: 18 18  Temp: 37.8 C 37.5 C  SpO2: 96% 94%    Last Pain:  Vitals:   05/25/17 0646  TempSrc: Oral  PainSc:    Pain Goal: Patients Stated Pain Goal: 5 (05/25/17 0330)               Jamie Brooks

## 2017-05-25 NOTE — Op Note (Signed)
Cesarean Section Operative Report  Jamie Brooks  PROCEDURE DATE: 05/25/2017  PREOPERATIVE DIAGNOSES: Intrauterine pregnancy at 2981w3d weeks gestation; Pre-eclampsia; Failure to Progress; intraamniotic infection  POSTOPERATIVE DIAGNOSES: The same  PROCEDURE: Primary Low Transverse Cesarean Section  SURGEON:   Surgeon(s) and Role:    * Eure, Amaryllis DykeLuther H, MD - Primary - Attending    * Nolene EbbsJulie Makhari Dovidio, MD - OB Fellow   INDICATIONS: Jamie Brooks is a 22 y.o. G1P1001 at 6581w3d here for cesarean section secondary to the indications listed under preoperative diagnoses; please see preoperative note for further details.  The risks of cesarean section were discussed with the patient including but were not limited to: bleeding which may require transfusion or reoperation; infection which may require antibiotics; injury to bowel, bladder, ureters or other surrounding organs; injury to the fetus; need for additional procedures including hysterectomy in the event of a life-threatening hemorrhage; placental abnormalities wth subsequent pregnancies, incisional problems, thromboembolic phenomenon and other postoperative/anesthesia complications.   The patient concurred with the proposed plan, giving informed written consent for the procedure.    FINDINGS:  Viable female infant in cephalic presentation, occiput posterior.  Apgars 5 and 8.  Meconium-stained amniotic fluid.  Intact placenta, three vessel cord.  Normal uterus, fallopian tubes and ovaries bilaterally.  ANESTHESIA: Epidural INTRAVENOUS FLUIDS: 1,200 ml ESTIMATED BLOOD LOSS: 980 mL URINE OUTPUT:  300 ml SPECIMENS: Placenta sent to pathology COMPLICATIONS: None immediate  PROCEDURE IN DETAIL:  The patient preoperatively received intravenous antibiotics and had sequential compression devices applied to her lower extremities.  She was then taken to the operating room where the epidural anesthesia was dosed up to surgical level and was found to be  adequate. She was then placed in a dorsal supine position with a leftward tilt, and prepped and draped in a sterile manner.  A foley catheter was placed into her bladder and attached to constant gravity.     A time out was held and the above information confirmed.  A Pfannenstiel incision  was made and carried down through the subcutaneous tissue to the fascia. Fascial incision was made and extended transversely. The fascia was separated from the underlying rectus tissue superiorly and inferiorly. The peritoneum was identified and entered. Peritoneal incision was extended longitudinally.  A low transverse hysterotomy was made with a scalpel and extended bilaterally bluntly. It was difficulty to deliver the fetal head, therefore a vacuum was applied to fetal head, and the infant was successfully delivered vertex, the cord was clamped and cut after one minute, and the infant was handed over to the awaiting neonatology team. Cord blood was obtained for evaluation. The placenta was delivered intact and appeared normal. The uterine outline, tubes and ovaries appeared normal. The uterine incision was closed with running locked sutures of 0 Monocryl, and an imbricating layer was also placed with running locked 0 Monocryl. Hemostasis was observed. Lavage was carried out until clear. The fascia was then closed using 0 Vicryl in a running fashion.  The subcutaneous layer was irrigated,  and  the skin was closed with a 4-0 Vicryl subcuticular stitch.    Disposition: PACU - hemodynamically stable.   Maternal Condition: stable   Signed: Frederik PearJulie P Erlean Mealor, MD OB Fellow 05/25/2017 2:28 AM .

## 2017-05-25 NOTE — Addendum Note (Signed)
Addendum  created 05/25/17 0803 by Kindra Bickham O, CRNA   Sign clinical note    

## 2017-05-26 LAB — CBC
HEMATOCRIT: 22.5 % — AB (ref 36.0–46.0)
HEMOGLOBIN: 7.3 g/dL — AB (ref 12.0–15.0)
MCH: 28.2 pg (ref 26.0–34.0)
MCHC: 32.4 g/dL (ref 30.0–36.0)
MCV: 86.9 fL (ref 78.0–100.0)
Platelets: 147 10*3/uL — ABNORMAL LOW (ref 150–400)
RBC: 2.59 MIL/uL — ABNORMAL LOW (ref 3.87–5.11)
RDW: 17 % — AB (ref 11.5–15.5)
WBC: 13 10*3/uL — ABNORMAL HIGH (ref 4.0–10.5)

## 2017-05-26 NOTE — Progress Notes (Addendum)
Post-Op Day 1, primary CS for arrest of labor  Subjective: No complaints, up ad lib, voiding and tolerating PO, passing flatus,small lochia,.plans to breastfeed, NFP  Objective: Blood pressure 128/79, pulse 92, temperature 97.9 F (36.6 C), temperature source Oral, resp. rate 18, height 4\' 11"  (1.499 m), weight 66.6 kg (146 lb 12.8 oz), SpO2 98 %, unknown if currently breastfeeding.  Physical Exam:  General: alert, cooperative and no distress Lochia:normal flow Chest: CTAB Heart: RRR no m/r/g Abdomen: +BS, soft, nontender, dsg c/d/intact, no erythema Uterine Fundus: firm DVT Evaluation: No evidence of DVT seen on physical exam. Extremities: trace edema  Recent Labs    05/25/17 0258 05/26/17 0521  HGB 8.5* 7.3*  HCT 26.0* 22.5*    Assessment/Plan: Breastfeeding and Lactation consult   LOS: 3 days   CRESENZO-DISHMAN,Sayvon Arterberry 05/26/2017, 7:25 AM

## 2017-05-26 NOTE — Progress Notes (Signed)
Post Op Day 1 Subjective: no complaints, up ad lib and + flatus  Objective: Blood pressure 128/79, pulse 92, temperature 97.9 F (36.6 C), temperature source Oral, resp. rate 18, height 4\' 11"  (1.499 m), weight 66.6 kg (146 lb 12.8 oz), SpO2 98 %, unknown if currently breastfeeding.  Physical Exam:  General: alert, cooperative and no distress Lochia: appropriate Uterine Fundus: firm Incision: healing well, no significant drainage DVT Evaluation: No evidence of DVT seen on physical exam. No cords or calf tenderness. No significant calf/ankle edema.  Recent Labs    05/25/17 0258 05/26/17 0521  HGB 8.5* 7.3*  HCT 26.0* 22.5*    Assessment/Plan: Plan for discharge tomorrow, Breastfeeding and Contraception NFP   LOS: 3 days   Oralia ManisSherin Abraham 05/26/2017, 7:41 AM   I spoke with and examined patient and agree with resident/PA/SNM's note and plan of care.  Cathie BeamsFran Cresenzo-Dishmon, CNM 05/26/2017 8:00 AM

## 2017-05-26 NOTE — Progress Notes (Signed)
CS[facility] received consult for [facility].  CSW reviewed chart, which states that [facility] [place], but records are not in her chart.  Parents were very quiet, but pleasant and welcoming of CSW's visit.  MOB was breast feeding her infant when CSW arrived.  During conversation, baby came unlatched and began to cry.  FOB took her, swaddled her, and soothed her to sleep.  Bonding appears evident by both parents.  CSW notes in chart that parents were planning a home birth, but ended up coming to the hospital and MOB needed a c-section for delivery.  CSW inquired as to how they are feeling with such a major change in their birth plan.  They both conclude that they are happy that baby is here and healthy, which is all that matters.  CSW encouraged them to acknowledge and process their feelings and commends them on their ability to focus on the positive outcome that they received.   CSW inquired about their plan and their beliefs.  MOB states that they are Christians and believe in natural things.  She states they wanted to deliver at home because they wanted to experience birth the way God intended it to be.  FOB states they are open to medical intervention when it is deemed necessary, which is why they came to the hospital.  He states his mother was with them and that she is a doula.   Parents state they have good supports and everything they need for baby at home.  They are aware of SIDS precautions and were able to tell CSW about this to some degree.  CSW provided education.  CSW also encouraged them to monitor their emotional health and informed them of signs and symptoms of PMADs and the importance of speaking with a medical professional if they have concerns at any time.  Although [facility] that [facility], since there are no records on file, CSW explained hospital drug screen policy.  Parents report understanding and no concerns.  CSW identifies no further need for intervention or  barriers to discharge.

## 2017-05-26 NOTE — Lactation Note (Signed)
This note was copied from a baby's chart. Lactation Consultation Note  Patient Name: Jamie Brooks NWGNF'AToday's Date: 05/26/2017 Reason for consult: Initial assessment  P1, 34 hours old.   Reviewed hand expression w/ glistening expressed. Assisted w/ latching baby - mother preferred cradle and had trouble with breast support due to IV placement. Was able to latch baby with some sucking noises.  Had mother bring baby in closer and not pull tissue away from nose. Mom encouraged to feed baby 8-12 times/24 hours and with feeding cues.  Mom made aware of O/P services, breastfeeding support groups, community resources, and our phone # for post-discharge questions.       Maternal Data Has patient been taught Hand Expression?: Yes Does the patient have breastfeeding experience prior to this delivery?: No  Feeding Feeding Type: Breast Fed  LATCH Score Latch: Grasps breast easily, tongue down, lips flanged, rhythmical sucking.  Audible Swallowing: A few with stimulation  Type of Nipple: Everted at rest and after stimulation  Comfort (Breast/Nipple): Soft / non-tender  Hold (Positioning): Assistance needed to correctly position infant at breast and maintain latch.  LATCH Score: 8  Interventions Interventions: Breast feeding basics reviewed;Assisted with latch;Skin to skin;Hand express;Adjust position;Support pillows  Lactation Tools Discussed/Used     Consult Status Consult Status: Follow-up Date: 05/27/17 Follow-up type: In-patient    Dahlia ByesBerkelhammer, Reinaldo Helt [facility] 05/26/2017, 12:05 PM

## 2017-05-27 DIAGNOSIS — O1494 Unspecified pre-eclampsia, complicating childbirth: Secondary | ICD-10-CM

## 2017-05-27 MED ORDER — FERROUS SULFATE 325 (65 FE) MG PO TABS: 325.0000 mg | ORAL_TABLET | Freq: Two times a day (BID) | ORAL | 3 refills | Status: AC

## 2017-05-27 MED ORDER — IBUPROFEN 600 MG PO TABS: 600.0000 mg | ORAL_TABLET | Freq: Four times a day (QID) | ORAL | 0 refills | Status: DC

## 2017-05-27 NOTE — Discharge Summary (Signed)
OB Discharge Summary     Patient Name: Jamie Brooks DOB: 02/08/1996 MRN: [id number]  Date of admission: 05/23/2017 Delivering MD: Lazaro ArmsEURE, LUTHER H   Date of discharge: 05/27/2017  Admitting diagnosis: 42wks Labor Intrauterine pregnancy: 4428w3d     Secondary diagnosis:  Active Problems:   Post-dates pregnancy   Pre-eclampsia, delivered  Additional problems: pre-eclampsia, triple I     Discharge diagnosis: Anemia and post term pregnancy delivered                                                                                                Post partum procedures:rhogam- not indicated as baby is Rh neg  Augmentation: AROM and Pitocin  Complications: Intrauterine Inflammation or infection (Chorioamniotis)  Hospital course:  Onset of Labor With Unplanned C/S  22 y.o. yo G1P1001 at 2228w3d was admitted in Latent Labor on 05/23/2017. Patient had a labor course significant for pre-e without severe features, post dates, limited prenatal care, and Triple I. Membrane Rupture Time/Date: 8:11 AM ,05/24/2017   The patient went for cesarean section due to failure to progress- she remained a rim and 0 station x multiple hours- and delivered a Viable infant,05/25/2017  Details of operation can be found in separate operative note. Patient had a  postpartum course remarkable for receiving Unasyn x 24h due to Triple I dx and C/S. She had some borderline BPs in the PP period but was not started on medication. She is ambulating,tolerating a regular diet, passing flatus, and urinating well.  Patient is discharged home in stable condition 05/27/17 per her request for early d/c. She had a SW consult while an inpt.  Physical exam  Vitals:   05/25/17 2344 05/26/17 0500 05/26/17 1926 05/27/17 0522  BP:  128/79 123/90 131/83  Pulse: 94 92 100 92  Resp:  18 18 18   Temp:  97.9 F (36.6 C) 98.2 F (36.8 C) 97.9 F (36.6 C)  TempSrc:  Oral Oral Oral  SpO2: 98%   98%  Weight:  66.6 kg (146 lb 12.8 oz)  65 kg  (143 lb 6.4 oz)  Height:       General: alert, cooperative and no distress Lochia: appropriate Uterine Fundus: firm Incision: Healing well with no significant drainage, Dressing is clean, dry, and intact DVT Evaluation: No evidence of DVT seen on physical exam. No cords or calf tenderness. No significant calf/ankle edema. Labs: Lab Results  Component Value Date   WBC 13.0 (H) 05/26/2017   HGB 7.3 (L) 05/26/2017   HCT 22.5 (L) 05/26/2017   MCV 86.9 05/26/2017   PLT 147 (L) 05/26/2017   CMP Latest Ref Rng & Units 05/23/2017  Glucose 65 - 99 mg/dL 93  BUN 6 - 20 mg/dL 11  Creatinine 2.130.44 - 0.861.00 mg/dL 5.780.67  Sodium 469135 - 629145 mmol/L 134(L)  Potassium 3.5 - 5.1 mmol/L 3.8  Chloride 101 - 111 mmol/L 103  CO2 22 - 32 mmol/L 21(L)  Calcium 8.9 - 10.3 mg/dL 5.2(W8.5(L)  Total Protein 6.5 - 8.1 g/dL 6.1(L)  Total Bilirubin 0.3 - 1.2 mg/dL 0.4  Alkaline Phos 38 - 126  U/L 142(H)  AST 15 - 41 U/L 25  ALT 14 - 54 U/L 13(L)    Discharge instruction: per After Visit Summary and "Baby and Me Booklet".  After visit meds:  Allergies as of 05/27/2017   No Known Allergies     Medication List    STOP taking these medications   B COMPLEX 1 PO   Hawthorne Berry 550 MG Caps   Magnesium 250 MG Tabs     TAKE these medications   ferrous sulfate 325 (65 FE) MG tablet Take 1 tablet (325 mg total) by mouth 2 (two) times daily with a meal.   ibuprofen 600 MG tablet Commonly known as:  ADVIL,MOTRIN Take 1 tablet (600 mg total) by mouth every 6 (six) hours.       Diet: routine diet  Activity: Advance as tolerated. Pelvic rest for 6 weeks.   Outpatient follow up:1 week for BP and incision, then 4 wk PP visit; inbox message sent to [facility] to schedule at closest location to pt Follow up Appt:No future appointments. Follow up Visit:No Follow-up on file.  Postpartum contraception: Natural Family Planning  Newborn Data: Live born female  Birth Weight: 8 lb 3.6 oz (3730 g) APGAR: 5,  8  Newborn Delivery   Birth date/time:  05/25/2017 01:33:00 Delivery type:  C-Section, Vacuum Assisted C-section categorization:  Primary     Baby Feeding: Breast Disposition:home with mother   05/27/2017 Oralia Manis, DO  CNM attestation I have seen and examined this patient and agree with above documentation in the resident's note.   Jamie Brooks is a 22 y.o. G1P1001 s/p pLTCS for failure to progress.   Pain is well controlled.  Plan for birth control is natural family planning (NFP).  Method of Feeding: breast Denies dizziness w/ ambulation and is requesting d/c today. States she is open to following up with [place] PP for BP eval- importance of this emphasized.  PE:  BP 131/83 (BP Location: Left Arm)   Pulse 92   Temp 97.9 F (36.6 C) (Oral)   Resp 18   Ht 4\' 11"  (1.499 m)   Wt 65 kg (143 lb 6.4 oz)   SpO2 98%   Breastfeeding? Unknown   BMI 28.96 kg/m  Fundus firm  Recent Labs    05/25/17 0258 05/26/17 0521  HGB 8.5* 7.3*  HCT 26.0* 22.5*     Plan: discharge today - postpartum care discussed - inbox msg sent to schedule pt for BP and incision check next week at our closest [facility] location to her  Pt declines a narcotic rx at time of discharge.   Cam Hai, CNM 9:53 AM 05/27/2017'

## 2017-05-27 NOTE — Discharge Instructions (Signed)

## 2017-05-27 NOTE — Lactation Note (Signed)
This note was copied from a baby's chart. Lactation Consultation Note  Patient Name: Jamie Karma GreaserMikayla Westley WUJWJ'XToday's Date: 05/27/2017 Reason for consult: Follow-up assessment   Infant's feedings have improved and baby is latching longer. Mother's nipples are tender.  Suggest applying ebm or coconut oil and flange lips. Mom encouraged to feed baby 8-12 times/24 hours and with feeding cues.  Reviewed engorgement care and monitoring voids/stools.    Maternal Data    Feeding Feeding Type: Breast Fed Length of feed: 90 min  LATCH Score Latch: Grasps breast easily, tongue down, lips flanged, rhythmical sucking.  Audible Swallowing: A few with stimulation  Type of Nipple: Everted at rest and after stimulation  Comfort (Breast/Nipple): Filling, red/small blisters or bruises, mild/mod discomfort  Hold (Positioning): Assistance needed to correctly position infant at breast and maintain latch.  LATCH Score: 7  Interventions    Lactation Tools Discussed/Used     Consult Status Consult Status: Complete    Hardie PulleyBerkelhammer, Ruth Boschen 05/27/2017, 3:21 PM

## 2017-06-05 ENCOUNTER — Ambulatory Visit: Payer: Medicaid Other

## 2017-07-11 ENCOUNTER — Ambulatory Visit: Payer: Medicaid Other | Admitting: Obstetrics and Gynecology

## 2019-05-10 NOTE — L&D Delivery Note (Signed)
Patient: Jamie Brooks MRN: [id number]  GBS status: PCR Neg, IAP given:None   Patient is a 24 y.o. now G2P2 s/p NSVD at 40+ weeks GA, who was admitted for SOL with failed home VBAC. AROM 3h prior to delivery with moderate medconium fluid.   Due to maternal exhaustion and fetal HR decelerations with prolonging recovery time between contractions, a VAVD was discussed.  Verbal consent: obtained from patient.  Risks and benefits discussed in detail.  Risks include, but are not limited to the risks of anesthesia, bleeding, infection, damage to maternal tissues, fetal cephalhematoma.  There is also the risk of inability to effect vaginal delivery of the head, or shoulder dystocia that cannot be resolved by established maneuvers, leading to the need for emergency cesarean section.   Delivery Note A Kiwi vacuum was placed at +2 station. After two pop offs, a Mighty Vac was placed at +3 station. At 11:45 AM a viable female was delivered via VBAC, Vacuum Assisted (Presentation:   Occiput Anterior).  APGAR: 8, 9; weight pending .   Placenta status: Spontaneous, Intact.  Cord: 3 vessels with the following complications: None.    Dr. Candelaria Celeste was present for delivery. Head delivered OA. No nuchal cord present. Shoulder and body delivered in usual fashion. Infant with spontaneous cry, placed on mother's abdomen, dried and bulb suctioned. Cord clamped x 2 after 1-minute delay, and cut by family member. Cord blood drawn. Placenta delivered spontaneously with gentle cord traction. Fundus firm with massage and Pitocin. Perineum inspected and found to have 3A laceration, which was repaired with 3.0 vicryl and 3.0 vicryl rapide with good hemostasis achieved. Dr. Adrian Blackwater was present to assist with repair.   Anesthesia: Epidural Episiotomy: None Lacerations: 3rd degree;Perineal Suture Repair: 3.0 vicryl and 3.0 vicryl rapide  Est. Blood Loss (mL): 667  Mom to postpartum.  Baby to Couplet care / Skin to  Skin.  De Hollingshead 08/13/2019, 12:32 PM

## 2019-08-13 ENCOUNTER — Encounter (HOSPITAL_COMMUNITY): Payer: Self-pay | Admitting: Obstetrics & Gynecology

## 2019-08-13 ENCOUNTER — Inpatient Hospital Stay (HOSPITAL_COMMUNITY): Payer: Medicaid Other | Admitting: Anesthesiology

## 2019-08-13 ENCOUNTER — Other Ambulatory Visit: Payer: Self-pay

## 2019-08-13 ENCOUNTER — Inpatient Hospital Stay (HOSPITAL_COMMUNITY)
Admission: AD | Admit: 2019-08-13 | Discharge: 2019-08-14 | DRG: 768 | Disposition: A | Discharge status: Home / Self Care | Payer: Medicaid Other | Attending: Family Medicine | Admitting: Family Medicine

## 2019-08-13 DIAGNOSIS — Z3A40 40 weeks gestation of pregnancy: Secondary | ICD-10-CM | POA: Diagnosis not present

## 2019-08-13 DIAGNOSIS — Z20822 Contact with and (suspected) exposure to covid-19: Secondary | ICD-10-CM | POA: Diagnosis present

## 2019-08-13 DIAGNOSIS — O411230 Chorioamnionitis, third trimester, not applicable or unspecified: Secondary | ICD-10-CM | POA: Diagnosis present

## 2019-08-13 DIAGNOSIS — O34211 Maternal care for low transverse scar from previous cesarean delivery: Secondary | ICD-10-CM | POA: Diagnosis present

## 2019-08-13 LAB — GROUP B STREP BY PCR: Group B strep by PCR: NEGATIVE

## 2019-08-13 LAB — CBC
HCT: 39.6 % (ref 36.0–46.0)
Hemoglobin: 13 g/dL (ref 12.0–15.0)
MCH: 30.5 pg (ref 26.0–34.0)
MCHC: 32.8 g/dL (ref 30.0–36.0)
MCV: 93 fL (ref 80.0–100.0)
Platelets: 209 10*3/uL (ref 150–400)
RBC: 4.26 MIL/uL (ref 3.87–5.11)
RDW: 14.5 % (ref 11.5–15.5)
WBC: 13.5 10*3/uL — ABNORMAL HIGH (ref 4.0–10.5)
nRBC: 0 % (ref 0.0–0.2)

## 2019-08-13 LAB — RAPID HIV SCREEN (HIV 1/2 AB+AG)
HIV 1/2 Antibodies: NONREACTIVE
HIV-1 P24 Antigen - HIV24: NONREACTIVE

## 2019-08-13 LAB — TYPE AND SCREEN
ABO/RH(D): O NEG
Antibody Screen: NEGATIVE

## 2019-08-13 LAB — RESPIRATORY PANEL BY RT PCR (FLU A&B, COVID)
Influenza A by PCR: NEGATIVE
Influenza B by PCR: NEGATIVE
SARS Coronavirus 2 by RT PCR: NEGATIVE

## 2019-08-13 LAB — ABO/RH: ABO/RH(D): O NEG

## 2019-08-13 LAB — HEPATITIS B SURFACE ANTIGEN: Hepatitis B Surface Ag: NONREACTIVE

## 2019-08-13 LAB — RPR: RPR Ser Ql: NONREACTIVE

## 2019-08-13 MED ORDER — TETANUS-DIPHTH-ACELL PERTUSSIS 5-2.5-18.5 LF-MCG/0.5 IM SUSP: 0.5000 mL | Freq: Once | INTRAMUSCULAR | Status: DC

## 2019-08-13 MED ORDER — ZOLPIDEM TARTRATE 5 MG PO TABS: 5.0000 mg | ORAL_TABLET | Freq: Every evening | ORAL | Status: DC | PRN

## 2019-08-13 MED ORDER — OXYTOCIN BOLUS FROM INFUSION
500.0000 mL | Freq: Once | INTRAVENOUS | Status: AC
  Administered 2019-08-13: 12:00:00 500 mL via INTRAVENOUS

## 2019-08-13 MED ORDER — EPHEDRINE 5 MG/ML INJ: 10.0000 mg | INTRAVENOUS | Status: DC | PRN

## 2019-08-13 MED ORDER — LACTATED RINGERS IV SOLN: INTRAVENOUS | Status: DC

## 2019-08-13 MED ORDER — FENTANYL CITRATE (PF) 100 MCG/2ML IJ SOLN: 50.0000 ug | INTRAMUSCULAR | Status: DC | PRN

## 2019-08-13 MED ORDER — ONDANSETRON HCL 4 MG/2ML IJ SOLN: 4.0000 mg | INTRAMUSCULAR | Status: DC | PRN

## 2019-08-13 MED ORDER — DOCUSATE SODIUM 100 MG PO CAPS: 100.0000 mg | ORAL_CAPSULE | Freq: Two times a day (BID) | ORAL | Status: DC | PRN

## 2019-08-13 MED ORDER — LACTATED RINGERS IV SOLN: 500.0000 mL | INTRAVENOUS | Status: DC | PRN

## 2019-08-13 MED ORDER — FENTANYL-BUPIVACAINE-NACL 0.5-0.125-0.9 MG/250ML-% EP SOLN: 12.0000 mL/h | EPIDURAL | Status: DC | PRN

## 2019-08-13 MED ORDER — SOD CITRATE-CITRIC ACID 500-334 MG/5ML PO SOLN
30.0000 mL | ORAL | Status: DC | PRN
  Filled 2019-08-13: qty 30

## 2019-08-13 MED ORDER — OXYTOCIN 40 UNITS IN NORMAL SALINE INFUSION - SIMPLE MED
INTRAVENOUS | Status: AC
  Filled 2019-08-13: qty 1000

## 2019-08-13 MED ORDER — SENNOSIDES-DOCUSATE SODIUM 8.6-50 MG PO TABS
2.0000 | ORAL_TABLET | ORAL | Status: DC
  Administered 2019-08-13: 2 via ORAL
  Filled 2019-08-13: qty 2

## 2019-08-13 MED ORDER — LIDOCAINE HCL (PF) 1 % IJ SOLN
30.0000 mL | INTRAMUSCULAR | Status: AC | PRN
  Administered 2019-08-13: 5 mL via SUBCUTANEOUS
  Administered 2019-08-13: 7 mL via SUBCUTANEOUS

## 2019-08-13 MED ORDER — ONDANSETRON HCL 4 MG/2ML IJ SOLN: 4.0000 mg | Freq: Four times a day (QID) | INTRAMUSCULAR | Status: DC | PRN

## 2019-08-13 MED ORDER — PHENYLEPHRINE 40 MCG/ML (10ML) SYRINGE FOR IV PUSH (FOR BLOOD PRESSURE SUPPORT): 80.0000 ug | PREFILLED_SYRINGE | INTRAVENOUS | Status: DC | PRN

## 2019-08-13 MED ORDER — FENTANYL-BUPIVACAINE-NACL 0.5-0.125-0.9 MG/250ML-% EP SOLN
EPIDURAL | Status: AC
  Filled 2019-08-13: qty 250

## 2019-08-13 MED ORDER — ACETAMINOPHEN 325 MG PO TABS: 650.0000 mg | ORAL_TABLET | ORAL | Status: DC | PRN

## 2019-08-13 MED ORDER — ONDANSETRON HCL 4 MG PO TABS: 4.0000 mg | ORAL_TABLET | ORAL | Status: DC | PRN

## 2019-08-13 MED ORDER — IBUPROFEN 600 MG PO TABS
600.0000 mg | ORAL_TABLET | Freq: Four times a day (QID) | ORAL | Status: DC
  Administered 2019-08-13 – 2019-08-14 (×4): 600 mg via ORAL
  Filled 2019-08-13 (×4): qty 1

## 2019-08-13 MED ORDER — OXYTOCIN 40 UNITS IN NORMAL SALINE INFUSION - SIMPLE MED: 2.5000 [IU]/h | INTRAVENOUS | Status: DC

## 2019-08-13 MED ORDER — SODIUM CHLORIDE (PF) 0.9 % IJ SOLN
INTRAMUSCULAR | Status: DC | PRN
  Administered 2019-08-13: 12 mL/h via EPIDURAL

## 2019-08-13 MED ORDER — DIPHENHYDRAMINE HCL 50 MG/ML IJ SOLN: 12.5000 mg | INTRAMUSCULAR | Status: DC | PRN

## 2019-08-13 MED ORDER — LACTATED RINGERS IV SOLN: 500.0000 mL | Freq: Once | INTRAVENOUS | Status: DC

## 2019-08-13 MED ORDER — WITCH HAZEL-GLYCERIN EX PADS: 1.0000 "application " | MEDICATED_PAD | CUTANEOUS | Status: DC | PRN

## 2019-08-13 MED ORDER — COCONUT OIL OIL: 1.0000 "application " | TOPICAL_OIL | Status: DC | PRN

## 2019-08-13 MED ORDER — POLYETHYLENE GLYCOL 3350 17 G PO PACK: 17.0000 g | PACK | Freq: Every day | ORAL | Status: DC

## 2019-08-13 MED ORDER — DIPHENHYDRAMINE HCL 25 MG PO CAPS: 25.0000 mg | ORAL_CAPSULE | Freq: Four times a day (QID) | ORAL | Status: DC | PRN

## 2019-08-13 MED ORDER — SIMETHICONE 80 MG PO CHEW: 80.0000 mg | CHEWABLE_TABLET | ORAL | Status: DC | PRN

## 2019-08-13 MED ORDER — MEASLES, MUMPS & RUBELLA VAC IJ SOLR: 0.5000 mL | Freq: Once | INTRAMUSCULAR | Status: DC

## 2019-08-13 MED ORDER — PRENATAL MULTIVITAMIN CH
1.0000 | ORAL_TABLET | Freq: Every day | ORAL | Status: DC
  Administered 2019-08-14: 1 via ORAL
  Filled 2019-08-13: qty 1

## 2019-08-13 MED ORDER — DIBUCAINE (PERIANAL) 1 % EX OINT: 1.0000 "application " | TOPICAL_OINTMENT | CUTANEOUS | Status: DC | PRN

## 2019-08-13 MED ORDER — BENZOCAINE-MENTHOL 20-0.5 % EX AERO
1.0000 "application " | INHALATION_SPRAY | CUTANEOUS | Status: DC | PRN
  Administered 2019-08-13: 1 via TOPICAL
  Filled 2019-08-13: qty 56

## 2019-08-13 MED ORDER — TERBUTALINE SULFATE 1 MG/ML IJ SOLN
INTRAMUSCULAR | Status: AC
  Filled 2019-08-13: qty 1

## 2019-08-13 NOTE — Progress Notes (Signed)
Pt refusing placement of FSE.  Educated on benefits of placing.  FOB does not want procedure as well.

## 2019-08-13 NOTE — H&P (Signed)
Abrina Ulloa is a 24 y.o. female G1P1001 with IUP at 40+ weeks presenting for hospital care after an attempted home TOLAC.  She started having contractions around 0400 yesterday, started leaking clear fluid around 0100 today.  States pushed for 3-4 hours, baby started having decels, so came to the hospital for care. Did not have any formal PNC. Cx 8-9/100/-1 now and upon arrival. No strong urge to push   Prenatal History/Complications:  CS for triple I/preE/FTD/OP baby (9242A)  Past Medical History: Past Medical History:  Diagnosis Date  . Hypertension     Past Surgical History: Past Surgical History:  Procedure Laterality Date  . CESAREAN SECTION N/A 05/25/2017   Procedure: CESAREAN SECTION;  Surgeon: Lazaro Arms, MD;  Location: [facility] BIRTHING SUITES;  Service: Obstetrics;  Laterality: N/A;    Obstetrical History: OB History    Gravida  1   Para  1   Term  1   Preterm      AB      Living  1     SAB      TAB      Ectopic      Multiple  0   Live Births  1            Social History: Social History   Socioeconomic History  . Marital status: Married    Spouse name: Not on file  . Number of children: Not on file  . Years of education: Not on file  . Highest education level: Not on file  Occupational History  . Not on file  Tobacco Use  . Smoking status: Never Smoker  . Smokeless tobacco: Never Used  Substance and Sexual Activity  . Alcohol use: No  . Drug use: No  . Sexual activity: Yes  Other Topics Concern  . Not on file  Social History Narrative  . Not on file   Social Determinants of Health   Financial Resource Strain:   . Difficulty of Paying Living Expenses:   Food Insecurity:   . Worried About Programme researcher, broadcasting/film/video in the Last Year:   . Barista in the Last Year:   Transportation Needs:   . Freight forwarder (Medical):   Marland Kitchen Lack of Transportation (Non-Medical):   Physical Activity:   . Days of Exercise per Week:   .  Minutes of Exercise per Session:   Stress:   . Feeling of Stress :   Social Connections:   . Frequency of Communication with Friends and Family:   . Frequency of Social Gatherings with Friends and Family:   . Attends Religious Services:   . Active Member of Clubs or Organizations:   . Attends Banker Meetings:   Marland Kitchen Marital Status:     Family History: History reviewed. No pertinent family history.  Allergies: No Known Allergies  Medications Prior to Admission  Medication Sig Dispense Refill Last Dose  . ferrous sulfate 325 (65 FE) MG tablet Take 1 tablet (325 mg total) by mouth 2 (two) times daily with a meal. 30 tablet 3   . ibuprofen (ADVIL,MOTRIN) 600 MG tablet Take 1 tablet (600 mg total) by mouth every 6 (six) hours. 30 tablet 0         Review of Systems   Constitutional: Negative for fever and chills Eyes: Negative for visual disturbances Respiratory: Negative for shortness of breath, dyspnea Cardiovascular: Negative for chest pain or palpitations  Gastrointestinal: Negative for vomiting, diarrhea and constipation.  POSITIVE for abdominal pain (contractions) Genitourinary: Negative for dysuria and urgency Musculoskeletal: Negative for back pain, joint pain, myalgias  Neurological: Negative for dizziness and headaches      Blood pressure 116/68, pulse (!) 104, temperature 98.4 F (36.9 C), temperature source Oral, SpO2 93 %, unknown if currently breastfeeding. General appearance: alert, cooperative and no distress Lungs: normal respiratory effort Heart: regular rate and rhythm Abdomen: soft, non-tender; bowel sounds normal Extremities: Homans sign is negative, no sign of DVT DTR's 2+ Presentation: cephalic Fetal monitoring  Baseline: 120 bpm, Variability: Good {> 6 bpm), Accelerations: Reactive and Decelerations: Variable: mild Uterine activity  2-4 minutes Dilation: 9 Effacement (%): 90 Station: 0, Plus 1 Exam by:: Armanda Heritage, RN     Prenatal labs: ABO, Rh: --/--/PENDING (04/06 0530) Antibody: PENDING (04/06 0530) Labs peding  Prenatal Transfer Tool  Maternal Diabetes: unknown Genetic Screening: Declined Maternal Ultrasounds/Referrals: none Fetal Ultrasounds or other Referrals:  no Maternal Substance Abuse:  No Significant Maternal Medications:  None Significant Maternal Lab Results: None     Results for orders placed or performed during the hospital encounter of 08/13/19 (from the past 24 hour(s))  CBC   Collection Time: 08/13/19  5:29 AM  Result Value Ref Range   WBC 13.5 (H) 4.0 - 10.5 K/uL   RBC 4.26 3.87 - 5.11 MIL/uL   Hemoglobin 13.0 12.0 - 15.0 g/dL   HCT 39.6 36.0 - 46.0 %   MCV 93.0 80.0 - 100.0 fL   MCH 30.5 26.0 - 34.0 pg   MCHC 32.8 30.0 - 36.0 g/dL   RDW 14.5 11.5 - 15.5 %   Platelets 209 150 - 400 K/uL   nRBC 0.0 0.0 - 0.2 %  Type and screen [facility]   Collection Time: 08/13/19  5:30 AM  Result Value Ref Range   ABO/RH(D) PENDING    Antibody Screen PENDING    Sample Expiration      08/16/2019,2359 Performed at [facility], [address] [place], [place] [address]     Assessment: Reaghan Kawa is a 24 y.o. G1P1001 with an IUP at term presenting for labor management, TOLAC  Plan: #Labor: expectant management #Pain:  Pt received epidural and is resting comfortably #FWB Cat 1, occ Cat 2 #ID: GBS: PCR pending  #MOF:  breast #MOC: declines   Christin Fudge 08/13/2019, 6:28 AM

## 2019-08-13 NOTE — Anesthesia Postprocedure Evaluation (Signed)
Anesthesia Post Note  Patient: Jamie Brooks  Procedure(s) Performed: AN AD HOC LABOR EPIDURAL     Patient location during evaluation: Mother Baby Anesthesia Type: Epidural Level of consciousness: awake and alert Pain management: pain level controlled Vital Signs Assessment: post-procedure vital signs reviewed and stable Respiratory status: spontaneous breathing, nonlabored ventilation and respiratory function stable Cardiovascular status: stable Postop Assessment: no headache, no backache and epidural receding Anesthetic complications: no    Last Vitals:  Vitals:   08/13/19 1030 08/13/19 1215  BP: 104/84 (!) 145/56  Pulse: (!) 101 (!) 128  Resp: 16 16  Temp:  36.9 C  SpO2:      Last Pain:  Vitals:   08/13/19 1215  TempSrc: Oral  PainSc:    Pain Goal:                   Jamie Brooks

## 2019-08-13 NOTE — Lactation Note (Signed)
This note was copied from a baby's chart. Lactation Consultation Note  Patient Name: Jamie Brooks SHFWY'O Date: 08/13/2019 Reason for consult: Initial assessment P2, 5 hour female infant. Per mom, infant had one stool ( meconium). Mom feels breastfeeding is going well, she only feels a tug when infant is latched, infant breastfed for 30 minutes in recovery then again at 1648 for 17 minutes prior to [facility] entering the room. LC did not observe latch at this time. Mom is experienced at breastfeeding she breastfed her 24 year old for 21 months exclusively, mom doesn't want a manual breast pump nor does she have DEBP at home. Mom has been doing STS with infant, was doing STS when [facility] was in room. Mom knows to breastfeed infant according to hunger cues, 8 to 12 times within 24 hours, on demand and not exceed 3 hours without breastfeeding infant. Mom knows to call RN or LC if she has any breastfeeding questions, concerns or need assistance with latching infant at breast. LC discussed discharge community resources: [facility] hotline, [facility] outpatient clinic and [facility] online breastfeeding support group.    Maternal Data Formula Feeding for Exclusion: No Has patient been taught Hand Expression?: Yes Does the patient have breastfeeding experience prior to this delivery?: Yes  Feeding    LATCH Score                   Interventions Interventions: Breast feeding basics reviewed;Skin to skin;Hand express;Expressed milk  Lactation Tools Discussed/Used WIC Program: No   Consult Status Consult Status: Follow-up Date: 08/14/19 Follow-up type: In-patient    Danelle Earthly 08/13/2019, 5:07 PM

## 2019-08-13 NOTE — Anesthesia Procedure Notes (Signed)
Epidural Patient location during procedure: OB Start time: 08/13/2019 6:00 AM End time: 08/13/2019 6:07 AM  Staffing Anesthesiologist: Leilani Able, MD Performed: anesthesiologist   Preanesthetic Checklist Completed: patient identified, IV checked, site marked, risks and benefits discussed, surgical consent, monitors and equipment checked, pre-op evaluation and timeout performed  Epidural Patient position: sitting Prep: DuraPrep and site prepped and draped Patient monitoring: continuous pulse ox and blood pressure Approach: midline Location: L3-L4 Injection technique: LOR air  Needle:  Needle type: Tuohy  Needle gauge: 17 G Needle length: 9 cm and 9 Needle insertion depth: 5 cm cm Catheter type: closed end flexible Catheter size: 19 Gauge Catheter at skin depth: 10 cm Test dose: negative and Other  Assessment Events: blood not aspirated, injection not painful, no injection resistance, no paresthesia and negative IV test  Additional Notes Reason for block:procedure for pain

## 2019-08-13 NOTE — Progress Notes (Signed)
Discussed POC for vacuum assisted delivery.  Pt and FOB refused.

## 2019-08-13 NOTE — Anesthesia Preprocedure Evaluation (Signed)
Anesthesia Evaluation  Patient identified by MRN, date of birth, ID band Patient awake    Reviewed: Allergy & Precautions, NPO status , Patient's Chart, lab work & pertinent test results  Airway Mallampati: II  TM Distance: >3 FB Neck ROM: Full    Dental no notable dental hx. (+) Dental Advisory Given   Pulmonary neg pulmonary ROS,    Pulmonary exam normal        Cardiovascular hypertension, Normal cardiovascular exam     Neuro/Psych negative neurological ROS  negative psych ROS   GI/Hepatic negative GI ROS, Neg liver ROS,   Endo/Other  Morbid obesity  Renal/GU negative Renal ROS  negative genitourinary   Musculoskeletal negative musculoskeletal ROS (+)   Abdominal (+) + obese,   Peds negative pediatric ROS (+)  Hematology negative hematology ROS (+)   Anesthesia Other Findings   Reproductive/Obstetrics (+) Pregnancy                             Anesthesia Physical  Anesthesia Plan  ASA: III  Anesthesia Plan: Epidural   Post-op Pain Management:    Induction:   PONV Risk Score and Plan:   Airway Management Planned: Natural Airway  Additional Equipment:   Intra-op Plan:   Post-operative Plan:   Informed Consent: I have reviewed the patients History and Physical, chart, labs and discussed the procedure including the risks, benefits and alternatives for the proposed anesthesia with the patient or authorized representative who has indicated his/her understanding and acceptance.       Plan Discussed with:   Anesthesia Plan Comments:         Anesthesia Quick Evaluation

## 2019-08-13 NOTE — Progress Notes (Signed)
Pt states she cannot feel pressure anymore.  Changed positions and laboring down until she feels more urge to push.

## 2019-08-13 NOTE — Discharge Summary (Signed)
OB Discharge Summary     Patient Name: Jamie Brooks DOB: 1995-07-28 MRN: [id number]  Date of admission: 08/13/2019 Delivering MD: Arvilla Market   Date of discharge: 08/14/2019  Admitting diagnosis: Normal labor [O80, Z37.9] Intrauterine pregnancy: Unknown     Secondary diagnosis:  Active Problems:   Normal labor   Type 3a perineal laceration  Additional problems: No prenatal care     Discharge diagnosis: Term Pregnancy Delivered                                                                                                Post partum procedures:None  Augmentation: AROM  Complications: None  Hospital course:  Onset of Labor With Vaginal Delivery     24 y.o. yo G2P1001 at Unknown was admitted in Active Labor on 08/13/2019. Patient had an uncomplicated labor course as follows: arrived to hospital after attempting home TOLAC, no prenatal care during pregnancy. On arrival was 8-9cm, she was managed expectantly until fully dilated and then had AROM with meconium. After pushing for 3 hours agreed to VAVD. Membrane Rupture Time/Date: 3:00 AM ,08/13/2019   Intrapartum Procedures: Episiotomy: None [1]                                         Lacerations:  3rd degree [4];Perineal [11]  Patient had a delivery of a Viable infant. 08/13/2019  Information for the patient's newborn:  Mallorie, Norrod [[id number]]  Delivery Method: VBAC, Vacuum     Delivery complicated by 3a perineal laceration, she was given laxatives. Patient had an uncomplicated postpartum course.  She is ambulating, tolerating a regular diet, passing flatus, and urinating well. Patient is discharged home in stable condition on 08/14/19.  She was offered follow up appointment with [facility] at time of discharge but was undecided if she was going to do so.   Physical exam  Vitals:   08/13/19 1500 08/13/19 1925 08/14/19 0004 08/14/19 0500  BP: 121/72 (!) 118/54 119/77 111/67  Pulse: 96 (!) 108 99 88  Resp: 16 18 19  18   Temp: 98.7 F (37.1 C) 98.6 F (37 C) 97.7 F (36.5 C) 97.9 F (36.6 C)  TempSrc: Oral Oral Oral Oral  SpO2: 98% 98% 98% 100%   General: alert, cooperative and no distress Lochia: appropriate Uterine Fundus: firm Incision: N/A DVT Evaluation: No evidence of DVT seen on physical exam. No significant calf/ankle edema. Labs: Lab Results  Component Value Date   WBC 13.5 (H) 08/13/2019   HGB 13.0 08/13/2019   HCT 39.6 08/13/2019   MCV 93.0 08/13/2019   PLT 209 08/13/2019   CMP Latest Ref Rng & Units 05/23/2017  Glucose 65 - 99 mg/dL 93  BUN 6 - 20 mg/dL 11  Creatinine 05/25/2017 - 1.57 mg/dL 2.62  Sodium 0.35 - 597 mmol/L 134(L)  Potassium 3.5 - 5.1 mmol/L 3.8  Chloride 101 - 111 mmol/L 103  CO2 22 - 32 mmol/L 21(L)  Calcium 8.9 - 10.3 mg/dL 416)  Total Protein  6.5 - 8.1 g/dL 6.1(L)  Total Bilirubin 0.3 - 1.2 mg/dL 0.4  Alkaline Phos 38 - 126 U/L 142(H)  AST 15 - 41 U/L 25  ALT 14 - 54 U/L 13(L)    Discharge instruction: per After Visit Summary and "Baby and Me Booklet".  After visit meds:  Allergies as of 08/14/2019   No Known Allergies     Medication List    TAKE these medications   acetaminophen 325 MG tablet Commonly known as: Tylenol Take 2 tablets (650 mg total) by mouth every 4 (four) hours as needed (for pain scale < 4).   ferrous sulfate 325 (65 FE) MG tablet Take 1 tablet (325 mg total) by mouth 2 (two) times daily with a meal.   ibuprofen 600 MG tablet Commonly known as: ADVIL Take 1 tablet (600 mg total) by mouth every 6 (six) hours.   polyethylene glycol 17 g packet Commonly known as: MIRALAX / GLYCOLAX Take 17 g by mouth daily.   prenatal multivitamin Tabs tablet Take 1 tablet by mouth daily at 12 noon.       Diet: routine diet  Activity: Advance as tolerated. Pelvic rest for 6 weeks.   Outpatient follow up:6 weeks Follow up Appt:No future appointments. Follow up Visit:No follow-ups on file.  Postpartum contraception: Natural Family  Planning  Newborn Data: Live born female  Birth Weight:  4040 grams APGAR: 71, 9  Newborn Delivery   Birth date/time: 08/13/2019 11:45:00 Delivery type: VBAC, Vacuum Assisted      Baby Feeding: Breast Disposition:home with mother   08/14/2019 Clarnce Flock, MD

## 2019-08-13 NOTE — MAU Note (Signed)
Pt reports to MAU with CTX, she was attempting a home birth, she stated she was a ten and the baby was having D cells so she came in. No prenatal care. Previous cesarean.

## 2019-08-14 ENCOUNTER — Encounter (HOSPITAL_COMMUNITY): Payer: Self-pay | Admitting: Obstetrics & Gynecology

## 2019-08-14 DIAGNOSIS — Z3A40 40 weeks gestation of pregnancy: Secondary | ICD-10-CM

## 2019-08-14 LAB — RUBELLA SCREEN: Rubella: 1 index (ref >0.99)

## 2019-08-14 MED ORDER — PRENATAL MULTIVITAMIN CH: 1.0000 | ORAL_TABLET | Freq: Every day | ORAL | 11 refills | Status: AC

## 2019-08-14 MED ORDER — POLYETHYLENE GLYCOL 3350 17 G PO PACK: 17.0000 g | PACK | Freq: Every day | ORAL | 1 refills | Status: AC

## 2019-08-14 MED ORDER — ACETAMINOPHEN 325 MG PO TABS: 650.0000 mg | ORAL_TABLET | ORAL | 0 refills | Status: AC | PRN

## 2019-08-14 MED ORDER — IBUPROFEN 600 MG PO TABS: 600.0000 mg | ORAL_TABLET | Freq: Four times a day (QID) | ORAL | 0 refills | Status: AC

## 2019-08-14 NOTE — Progress Notes (Signed)
CSW notified that MOB and Fob are wanting to leave AMA at this time.  CSW in the process of making CPS report to [facility] for MOB's desire to leave AMA.    Claude Manges Safiyyah Vasconez, MSW, LCSW [facility] [place] [phone]

## 2019-08-14 NOTE — Progress Notes (Signed)
CSW made report to [place] CPS at this time.      Kamyiah Colantonio S. Izabell Schalk, MSW, LCSW [facility] [place] [phone]   

## 2019-08-14 NOTE — Lactation Note (Signed)
This note was copied from a baby's chart. Lactation Consultation Note: Mother is a P2 , infant is 90hours old and is now at % wt loss.  Assist mother with proper pillow support . Observed a feeding. Infant has good depth and observed milk flowing from mothers breast and infants mouth when infant released the breast.   Assist mother with latching infant on the alternate breast . Mother advised to do good breast massage and use ice to treat engorgement. Infant has not yet voided.  Mother advised to continue to  feed on both breast.  Mother to continue to cue base feed infant and feed at least 8-12 times or more in 24 hours and advised to allow for cluster feeding infant as needed.   Mother to continue to due STS. Mother is aware of available LC services at [facility], BFSG'S, OP Dept, and phone # for questions or concerns about breastfeeding.  Mother receptive to all teaching and plan of care.    Patient Name: Jamie Brooks FSELT'R Date: 08/14/2019 Reason for consult: Follow-up assessment   Maternal Data    Feeding Feeding Type: Breast Fed  LATCH Score Latch: Grasps breast easily, tongue down, lips flanged, rhythmical sucking.  Audible Swallowing: Spontaneous and intermittent  Type of Nipple: Everted at rest and after stimulation  Comfort (Breast/Nipple): Soft / non-tender  Hold (Positioning): Assistance needed to correctly position infant at breast and maintain latch.  LATCH Score: 9  Interventions Interventions: Assisted with latch;Skin to skin;Hand express;Breast compression;Adjust position;Support pillows;Position options;Expressed milk  Lactation Tools Discussed/Used     Consult Status Consult Status: Complete    Jamie Brooks 08/14/2019, 12:12 PM

## 2019-08-14 NOTE — Discharge Instructions (Signed)

## 2019-08-14 NOTE — Clinical Social Work Maternal (Signed)
CLINICAL SOCIAL WORK MATERNAL/CHILD NOTE  Patient Details  Name: Jamie Brooks MRN: [id number] Date of Birth: [birth year]  Date:  08/14/2019  Clinical Social Worker Initiating Note:  Hortencia Pilar, LCSW Date/Time: Initiated:  08/14/19/0935     Child's Name:  Jamie Brooks   Biological Parents:  Mother   Need for Interpreter:  None   Reason for Referral:  Late or No Prenatal Care    Address:  [address] [place] [address]    Phone number:  [phone] (home)     Additional phone number: none   Household Members/Support Persons (HM/SP):   Household Member/Support Person 1   HM/SP Name Relationship DOB or Age  HM/SP -1 Julious Payer FOB   HM/SP -2   Bora Bost  daughter   May 25, 2017  HM/SP -3   Takeya Marquis  [facility]   [birth year]1995  HM/SP -4        HM/SP -5        HM/SP -6        HM/SP -7        HM/SP -8          Natural Supports (not living in the home):  Parent, Immediate Family   Professional Supports: None   Employment: Unemployed   Type of Work: none   Education:  Some Materials engineer arranged:  n/a  Surveyor, quantity Resources:  Medicaid   Other Resources:    none reported   Cultural/Religious Considerations Which May Impact Care:  Christian   Strengths:  Ability to meet basic needs , Compliance with medical plan , Home prepared for child , Pediatrician chosen   Psychotropic Medications:      None reported "We are all healthy" per MOB.    Pediatrician:    [place]  Pediatrician List:   [facility] ([address] [employer])  [place]    [place]      Pediatrician Fax Number:    Risk Factors/Current Problems:  None   Cognitive State:  Able to Concentrate , Insightful , Alert    Mood/Affect:  Relaxed , Calm , Comfortable    CSW Assessment: CSW consulted as MOB didn't receive PNC during this pregnancy. CSW went to speak  with MOB at bedside to address further needs.   CSW congratulated MOB on the birth of infant. CSW advised MOB of CSW's role and the reason for CSW coming to visit with her. MOB reported that she did get care by a private midwife. MOB reports that she was seen for a few visits. CSW inquired from Mob on the name of the midwife and MOB reports "its a private one. I got care and I don't understand why having a midwife is not legal in [place]". CSW understanding of MOB's frustration and asked MOB how her pregnancy was overall. MOB expressed having a good pregnancy with little to no concerns. CSW took time to advised MOB of the hospital drug screen policy. MOB was advised that if infants UDS or CDS is positive for any substance that MOB then CSW would need to make a CPS report. MOB reported no substance use during her pregnancy and reports no prior history with CPS.  CSW took time to ask MOB about her mental health. MOB declined have any mental health diagnosis and reports that [place] with FOB's family currently. MOB  began to tell CSW about her 24 year old daughter and how excited daughter is for new sibling. MOB expressed that she is happy since the birth of infant and isn't feeing SI or HI.   MOB expressed that she has support from her family as well as FOB's family. MOB reports that she has all needed items to care for infant with not other needs at this time.   CSW will continue to monitor infants UDS and CDS to make CPS report if warranted.   CSW Plan/Description:  No Further Intervention Required/No Barriers to Discharge, Sudden Infant Death Syndrome (SIDS) Education, Perinatal Mood and Anxiety Disorder (PMADs) Education, CSW Will Continue to Monitor Umbilical Cord Tissue Drug Screen Results and Make Report if Warranted, Albert Lea, [place] 08/14/2019, 10:26 AM

## 2019-08-15 ENCOUNTER — Telehealth: Payer: Self-pay | Admitting: Family Medicine

## 2019-08-15 NOTE — Telephone Encounter (Signed)
Attempted to contact patient to speak to her to see if she would like to be scheduled for a postpartum visit.  Per Dr. Crissie Reese patient refused prenatal care by choice and to offer postpartum visit if patient wanted it. No answer, left voicemail for the patient to give the office a call back if she would like an appointment.
